# Patient Record
Sex: Female | Born: 1990 | Race: White | Hispanic: No | Marital: Married | Smoking: Former smoker
Health system: Southern US, Community
[De-identification: ages and names within clinical notes are randomized; demographics above are authoritative.]

## PROBLEM LIST (undated history)

## (undated) DIAGNOSIS — F329 Major depressive disorder, single episode, unspecified: Secondary | ICD-10-CM

## (undated) DIAGNOSIS — F32A Depression, unspecified: Secondary | ICD-10-CM

## (undated) DIAGNOSIS — Z8489 Family history of other specified conditions: Secondary | ICD-10-CM

## (undated) DIAGNOSIS — J45909 Unspecified asthma, uncomplicated: Secondary | ICD-10-CM

## (undated) DIAGNOSIS — K219 Gastro-esophageal reflux disease without esophagitis: Secondary | ICD-10-CM

## (undated) HISTORY — DX: Depression, unspecified: F32.A

## (undated) HISTORY — DX: Unspecified asthma, uncomplicated: J45.909

---
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
## 2008-09-28 ENCOUNTER — Emergency Department (HOSPITAL_COMMUNITY): Admission: EM | Admit: 2008-09-28 | Discharge: 2008-09-28 | Payer: Self-pay | Admitting: Emergency Medicine

## 2011-01-21 ENCOUNTER — Encounter: Payer: Self-pay | Admitting: Gastroenterology

## 2011-10-01 LAB — DIFFERENTIAL
Basophils Absolute: 0
Basophils Relative: 0
Eosinophils Absolute: 0
Monocytes Absolute: 0.4
Neutro Abs: 4.1
Neutrophils Relative %: 82 — ABNORMAL HIGH

## 2011-10-01 LAB — URINALYSIS, ROUTINE W REFLEX MICROSCOPIC
Bilirubin Urine: NEGATIVE
Glucose, UA: NEGATIVE
Hgb urine dipstick: NEGATIVE
pH: 7.5

## 2011-10-01 LAB — CBC
Hemoglobin: 13.1
MCHC: 33.9
MCV: 87.2
RBC: 4.43
RDW: 12.7

## 2011-10-01 LAB — COMPREHENSIVE METABOLIC PANEL
ALT: 17
AST: 20
Albumin: 3.9
Glucose, Bld: 88
Total Bilirubin: 0.7
Total Protein: 6.7

## 2013-04-02 ENCOUNTER — Telehealth: Payer: Self-pay | Admitting: Nurse Practitioner

## 2013-04-02 NOTE — Telephone Encounter (Signed)
APPT MADE

## 2013-04-09 ENCOUNTER — Ambulatory Visit: Payer: Self-pay | Admitting: Nurse Practitioner

## 2013-05-18 ENCOUNTER — Telehealth: Payer: Self-pay | Admitting: *Deleted

## 2013-05-18 NOTE — Telephone Encounter (Signed)
Created in error

## 2013-06-02 ENCOUNTER — Encounter: Payer: Self-pay | Admitting: Nurse Practitioner

## 2013-06-02 ENCOUNTER — Ambulatory Visit (INDEPENDENT_AMBULATORY_CARE_PROVIDER_SITE_OTHER): Payer: PRIVATE HEALTH INSURANCE | Admitting: Nurse Practitioner

## 2013-06-02 VITALS — BP 103/66 | HR 90 | Temp 97.0°F | Ht 67.0 in | Wt 155.0 lb

## 2013-06-02 DIAGNOSIS — F329 Major depressive disorder, single episode, unspecified: Secondary | ICD-10-CM | POA: Insufficient documentation

## 2013-06-02 MED ORDER — CITALOPRAM HYDROBROMIDE 20 MG PO TABS: 20.0000 mg | ORAL_TABLET | Freq: Every day | ORAL | Status: DC

## 2013-06-02 NOTE — Progress Notes (Signed)
  Subjective:    Patient ID: Shandel Busic, female    DOB: Feb 16, 1991, 22 y.o.   MRN: [id number]  HPI Patient currently on celexa for depression and anxiety- she is doing well on current dose- has no side effects from medication.    Review of Systems  All other systems reviewed and are negative.       Objective:   Physical Exam  Constitutional: She appears well-developed and well-nourished.  Cardiovascular: Normal rate, normal heart sounds and intact distal pulses.   Pulmonary/Chest: Effort normal and breath sounds normal.  Psychiatric: She has a normal mood and affect. Her behavior is normal. Judgment and thought content normal.    BP 103/66  Pulse 90  Temp(Src) 97 F (36.1 C) (Oral)  Ht 5\' 7"  (1.702 m)  Wt 155 lb (70.308 kg)  BMI 24.27 kg/m2  LMP 06/02/2013       Assessment & Plan:  1. Depression Stress management Exercise 3X a week - citalopram (CELEXA) 20 MG tablet; Take 1 tablet (20 mg total) by mouth daily.  Dispense: 30 tablet; Refill: 5   Mary-Margaret Daphine Deutscher, FNP

## 2013-06-02 NOTE — Patient Instructions (Signed)

## 2013-10-21 ENCOUNTER — Other Ambulatory Visit: Payer: Self-pay

## 2013-10-21 MED ORDER — BUDESONIDE-FORMOTEROL FUMARATE 160-4.5 MCG/ACT IN AERO: 2.0000 | INHALATION_SPRAY | Freq: Two times a day (BID) | RESPIRATORY_TRACT | Status: DC

## 2013-11-24 ENCOUNTER — Ambulatory Visit (INDEPENDENT_AMBULATORY_CARE_PROVIDER_SITE_OTHER): Payer: 59 | Admitting: General Practice

## 2013-11-24 ENCOUNTER — Encounter: Payer: Self-pay | Admitting: General Practice

## 2013-11-24 VITALS — BP 129/79 | HR 115 | Temp 97.6°F | Ht 67.0 in | Wt 162.0 lb

## 2013-11-24 DIAGNOSIS — J45909 Unspecified asthma, uncomplicated: Secondary | ICD-10-CM

## 2013-11-24 NOTE — Progress Notes (Signed)
  Subjective:    Patient ID: Misty Butler, female    DOB: April 25, 1991, 22 y.o.   MRN: [id number]  HPI Patient presents today for medication refills for albuterol and symbicort. She reports using medications as prescribed and finds it very effective.     Review of Systems  Constitutional: Negative for fever and chills.  Respiratory: Positive for cough and shortness of breath. Negative for chest tightness and wheezing.        Coughing and shortness of breath on exertion  Cardiovascular: Negative for chest pain and palpitations.  Neurological: Negative for dizziness, weakness and headaches.       Objective:   Physical Exam  Constitutional: She is oriented to person, place, and time. She appears well-developed and well-nourished.  Cardiovascular: Normal rate, regular rhythm and normal heart sounds.   Pulmonary/Chest: Effort normal and breath sounds normal. No respiratory distress. She exhibits no tenderness.  Neurological: She is alert and oriented to person, place, and time.  Skin: Skin is warm and dry.  Psychiatric: She has a normal mood and affect.          Assessment & Plan:  1. Asthma - budesonide-formoterol (SYMBICORT) 160-4.5 MCG/ACT inhaler; Inhale 2 puffs into the lungs 2 (two) times daily.  Dispense: 1 Inhaler; Refill: 3 - albuterol (PROVENTIL HFA;VENTOLIN HFA) 108 (90 BASE) MCG/ACT inhaler; Inhale 2 puffs into the lungs every 6 (six) hours as needed for wheezing or shortness of breath.  Dispense: 1 Inhaler; Refill: 3 -discussed and provided information on asthma -RTO prn and in 6 months for follow up Patient verbalized understanding Coralie Keens, FNP-C

## 2013-11-24 NOTE — Patient Instructions (Signed)
Asthma, Adult Asthma is a recurring condition in which the airways tighten and narrow. Asthma can make it difficult to breathe. It can cause coughing, wheezing, and shortness of breath. Asthma episodes (also called asthma attacks) range from minor to life-threatening. Asthma cannot be cured, but medicines and lifestyle changes can help control it. CAUSES Asthma is believed to be caused by inherited (genetic) and environmental factors, but its exact cause is unknown. Asthma may be triggered by allergens, lung infections, or irritants in the air. Asthma triggers are different for each person. Common triggers include:   Animal dander.  Dust mites.  Cockroaches.  Pollen from trees or grass.  Mold.  Smoke.  Air pollutants such as dust, household cleaners, hair sprays, aerosol sprays, paint fumes, strong chemicals, or strong odors.  Cold air, weather changes, and winds (which increase molds and pollens in the air).  Strong emotional expressions such as crying or laughing hard.  Stress.  Certain medicines (such as aspirin) or types of drugs (such as beta-blockers).  Sulfites in foods and drinks. Foods and drinks that may contain sulfites include dried fruit, potato chips, and sparkling grape juice.  Infections or inflammatory conditions such as the flu, a cold, or an inflammation of the nasal membranes (rhinitis).  Gastroesophageal reflux disease (GERD).  Exercise or strenuous activity. SYMPTOMS Symptoms may occur immediately after asthma is triggered or many hours later. Symptoms include:  Wheezing.  Excessive nighttime or early morning coughing.  Frequent or severe coughing with a common cold.  Chest tightness.  Shortness of breath. DIAGNOSIS  The diagnosis of asthma is made by a review of your medical history and a physical exam. Tests may also be performed. These may include:  Lung function studies. These tests show how much air you breath in and out.  Allergy  tests.  Imaging tests such as X-rays. TREATMENT  Asthma cannot be cured, but it can usually be controlled. Treatment involves identifying and avoiding your asthma triggers. It also involves medicines. There are 2 classes of medicine used for asthma treatment:   Controller medicines. These prevent asthma symptoms from occurring. They are usually taken every day.  Reliever or rescue medicines. These quickly relieve asthma symptoms. They are used as needed and provide short-term relief. Your health care provider will help you create an asthma action plan. An asthma action plan is a written plan for managing and treating your asthma attacks. It includes a list of your asthma triggers and how they may be avoided. It also includes information on when medicines should be taken and when their dosage should be changed. An action plan may also involve the use of a device called a peak flow meter. A peak flow meter measures how well the lungs are working. It helps you monitor your condition. HOME CARE INSTRUCTIONS   Take medicine as directed by your health care provider. Speak with your health care provider if you have questions about how or when to take the medicines.  Use a peak flow meter as directed by your health care provider. Record and keep track of readings.  Understand and use the action plan to help minimize or stop an asthma attack without needing to seek medical care.  Control your home environment in the following ways to help prevent asthma attacks:  Do not smoke. Avoid being exposed to secondhand smoke.  Change your heating and air conditioning filter regularly.  Limit your use of fireplaces and wood stoves.  Get rid of pests (such as roaches and   mice) and their droppings.  Throw away plants if you see mold on them.  Clean your floors and dust regularly. Use unscented cleaning products.  Try to have someone else vacuum for you regularly. Stay out of rooms while they are being  vacuumed and for a short while afterward. If you vacuum, use a dust mask from a hardware store, a double-layered or microfilter vacuum cleaner bag, or a vacuum cleaner with a HEPA filter.  Replace carpet with wood, tile, or vinyl flooring. Carpet can trap dander and dust.  Use allergy-proof pillows, mattress covers, and box spring covers.  Wash bed sheets and blankets every week in hot water and dry them in a dryer.  Use blankets that are made of polyester or cotton.  Clean bathrooms and kitchens with bleach. If possible, have someone repaint the walls in these rooms with mold-resistant paint. Keep out of the rooms that are being cleaned and painted.  Wash hands frequently. SEEK MEDICAL CARE IF:   You have wheezing, shortness of breath, or a cough even if taking medicine to prevent attacks.  The colored mucus you cough up (sputum) is thicker than usual.  Your sputum changes from clear or white to yellow, green, gray, or bloody.  You have any problems that may be related to the medicines you are taking (such as a rash, itching, swelling, or trouble breathing).  You are using a reliever medicine more than 2 3 times per week.  Your peak flow is still at 50 79% of you personal best after following your action plan for 1 hour. SEEK IMMEDIATE MEDICAL CARE IF:   You seem to be getting worse and are unresponsive to treatment during an asthma attack.  You are short of breath even at rest.  You get short of breath when doing very little physical activity.  You have difficulty eating, drinking, or talking due to asthma symptoms.  You develop chest pain.  You develop a fast heartbeat.  You have a bluish color to your lips or fingernails.  You are lightheaded, dizzy, or faint.  Your peak flow is less than 50% of your personal best.  You have a fever or persistent symptoms for more than 2 3 days.  You have a fever and symptoms suddenly get worse. MAKE SURE YOU:   Understand these  instructions.  Will watch your condition.  Will get help right away if you are not doing well or get worse. Document Released: 12/17/2005 Document Revised: 08/19/2013 Document Reviewed: 07/16/2013 Exit[employer] Patient Information 2014 [employer].  

## 2013-11-25 ENCOUNTER — Telehealth: Payer: Self-pay | Admitting: General Practice

## 2013-11-27 ENCOUNTER — Other Ambulatory Visit: Payer: Self-pay | Admitting: General Practice

## 2013-11-27 MED ORDER — ALBUTEROL SULFATE HFA 108 (90 BASE) MCG/ACT IN AERS: 2.0000 | INHALATION_SPRAY | Freq: Four times a day (QID) | RESPIRATORY_TRACT | Status: DC | PRN

## 2013-11-27 MED ORDER — BUDESONIDE-FORMOTEROL FUMARATE 160-4.5 MCG/ACT IN AERO: 2.0000 | INHALATION_SPRAY | Freq: Two times a day (BID) | RESPIRATORY_TRACT | Status: DC

## 2013-11-27 NOTE — Telephone Encounter (Signed)
She should check again, they were sent after she checked.

## 2013-11-27 NOTE — Telephone Encounter (Signed)
Patient aware.

## 2014-04-28 ENCOUNTER — Telehealth: Payer: Self-pay | Admitting: Nurse Practitioner

## 2014-04-28 NOTE — Telephone Encounter (Signed)
Noticed swelling below left eye 1 week ago. Some tenderness.  Swelling has improved. She has had some crusting over the past couple days but that was better today as well.  Today she woke up with upper eyelid swelling, redness, and discomfort.  No eyeball pain or visual disturbance.  She wears glasses and not contacts. She hasn't worn eye makeup in several weeks. No known injury.  Patient requested appt for 05/03/16 because she is off of work. Appt scheduled. Patient aware to call back if symptoms worsen or if she develops actual eye pain or visual disturbances to f/u with her eye doctor. Suggested cool compresses and ibuprofen for comfort.

## 2014-04-29 ENCOUNTER — Other Ambulatory Visit: Payer: Self-pay | Admitting: Nurse Practitioner

## 2014-04-29 ENCOUNTER — Encounter: Payer: Self-pay | Admitting: Nurse Practitioner

## 2014-04-29 ENCOUNTER — Ambulatory Visit (INDEPENDENT_AMBULATORY_CARE_PROVIDER_SITE_OTHER): Payer: BC Managed Care – PPO | Admitting: Nurse Practitioner

## 2014-04-29 VITALS — BP 111/78 | HR 68 | Temp 97.2°F | Ht 67.0 in | Wt 162.0 lb

## 2014-04-29 DIAGNOSIS — H01006 Unspecified blepharitis left eye, unspecified eyelid: Secondary | ICD-10-CM

## 2014-04-29 DIAGNOSIS — J45909 Unspecified asthma, uncomplicated: Secondary | ICD-10-CM

## 2014-04-29 DIAGNOSIS — H01009 Unspecified blepharitis unspecified eye, unspecified eyelid: Secondary | ICD-10-CM

## 2014-04-29 MED ORDER — ERYTHROMYCIN 5 MG/GM OP OINT
1.0000 | TOPICAL_OINTMENT | Freq: Four times a day (QID) | OPHTHALMIC | Status: DC
Start: 2014-04-29 — End: 2015-04-15

## 2014-04-29 MED ORDER — BUDESONIDE-FORMOTEROL FUMARATE 160-4.5 MCG/ACT IN AERO: 2.0000 | INHALATION_SPRAY | Freq: Two times a day (BID) | RESPIRATORY_TRACT | Status: DC

## 2014-04-29 NOTE — Patient Instructions (Signed)
Blepharitis Blepharitis is redness, soreness, and swelling (inflammation) of one or both eyelids. It may be caused by an allergic reaction or a bacterial infection. Blepharitis may also be associated with reddened, scaly skin (seborrhea) of the scalp and eyebrows. While you sleep, eye discharge may cause your eyelashes to stick together. Your eyelids may itch, burn, swell, and may lose their lashes. These will grow back. Your eyes may become sensitive. Blepharitis may recur and need repeated treatment. If this is the case, you may require further evaluation by an eye specialist (ophthalmologist). HOME CARE INSTRUCTIONS   Keep your hands clean.  Use a clean towel each time you dry your eyelids. Do not use this towel to clean other areas. Do not share a towel or makeup with anyone.  Wash your eyelids with warm water or warm water mixed with a small amount of baby shampoo. Do this twice a day or as often as needed.  Wash your face and eyebrows at least once a day.  Use warm compresses 2 times a day for 10 minutes at a time, or as directed by your caregiver.  Apply antibiotic ointment as directed by your caregiver.  Avoid rubbing your eyes.  Avoid wearing makeup until you get better.  Follow up with your caregiver as directed. SEEK IMMEDIATE MEDICAL CARE IF:   You have pain, redness, or swelling that gets worse or spreads to other parts of your face.  Your vision changes, or you have pain when looking at lights or moving objects.  You have a fever.  Your symptoms continue for longer than 2 to 4 days or become worse. MAKE SURE YOU:   Understand these instructions.  Will watch your condition.  Will get help right away if you are not doing well or get worse. Document Released: 12/14/2000 Document Revised: 03/10/2012 Document Reviewed: 01/24/2011 Exit[employer] Patient Information 2014 [employer].  

## 2014-04-29 NOTE — Progress Notes (Signed)
   Subjective:    Patient ID: Misty Butler, female    DOB: [birth year], 23 y.o.   MRN: [id number]  HPI Patient in c/o left upper lid swelling- Has had a crusty flaky discharge.- Eye lid is tender to touch.    Review of Systems  Constitutional: Negative.   HENT: Negative.   Eyes: Positive for pain and discharge. Negative for photophobia, redness, itching and visual disturbance.  Respiratory: Negative.   Cardiovascular: Negative.   Neurological: Negative.   Hematological: Negative.   All other systems reviewed and are negative.      Objective:   Physical Exam  Constitutional: She is oriented to person, place, and time. She appears well-developed and well-nourished.  Eyes: Conjunctivae and EOM are normal. Pupils are equal, round, and reactive to light. Left eye exhibits no discharge.  Left upper eyelid edema and tenderness  Cardiovascular: Normal rate and normal heart sounds.   Pulmonary/Chest: Effort normal and breath sounds normal.  Neurological: She is alert and oriented to person, place, and time.  Skin: Skin is warm and dry.  Psychiatric: She has a normal mood and affect. Her behavior is normal. Judgment and thought content normal.   BP 111/78  Pulse 68  Temp(Src) 97.2 F (36.2 C) (Oral)  Ht 5\' 7"  (1.702 m)  Wt 162 lb (73.483 kg)  BMI 25.37 kg/m2  LMP 04/29/2014        Assessment & Plan:   1. Blepharitis of left eye    Meds ordered this encounter  Medications  . erythromycin ophthalmic ointment    Sig: Place 1 application into the left eye 4 (four) times daily.    Dispense:  3.5 g    Refill:  0    Order Specific Question:  Supervising Provider    Answer:  Ernestina PennaMOORE, DONALD W [[id number]   Use of med discussed Warm compresses RTO prn  Mary-Margaret Daphine DeutscherMartin, FNP

## 2014-05-01 ENCOUNTER — Ambulatory Visit (INDEPENDENT_AMBULATORY_CARE_PROVIDER_SITE_OTHER): Payer: BC Managed Care – PPO | Admitting: General Practice

## 2014-05-01 ENCOUNTER — Encounter: Payer: Self-pay | Admitting: General Practice

## 2014-05-01 VITALS — BP 118/73 | HR 95 | Temp 97.3°F | Ht 67.0 in | Wt 162.8 lb

## 2014-05-01 DIAGNOSIS — H01009 Unspecified blepharitis unspecified eye, unspecified eyelid: Secondary | ICD-10-CM

## 2014-05-01 DIAGNOSIS — H01004 Unspecified blepharitis left upper eyelid: Secondary | ICD-10-CM

## 2014-05-01 MED ORDER — DOXYCYCLINE HYCLATE 100 MG PO TABS: 100.0000 mg | ORAL_TABLET | Freq: Two times a day (BID) | ORAL | Status: DC

## 2014-05-01 NOTE — Progress Notes (Signed)
   Subjective:    Patient ID: Misty Butler, female    DOB: 03/21/1991, 23 y.o.   MRN: [id number]  HPI Patient presents today with complaints of worsening left upper eyelid swelling and tenderness. The onset of eyelid swelling was 3 days ago, she was seen and prescribed erythromycin ointment two days ago. Reports using ointment as prescribed and following other home care instructions. Denies change or loss in vision. Currently on menstrual cycle.     Review of Systems  Constitutional: Negative for fever and chills.  Eyes: Negative for photophobia, discharge and visual disturbance.       Left upper eyelid swelling and tenderness  Respiratory: Negative for cough, chest tightness and shortness of breath.   Cardiovascular: Negative for chest pain and palpitations.       Objective:   Physical Exam  Constitutional: She is oriented to person, place, and time. She appears well-developed and well-nourished.  HENT:  Head: Normocephalic and atraumatic.  Right Ear: External ear normal.  Mouth/Throat: Oropharynx is clear and moist.  Eyes: Conjunctivae and EOM are normal. Pupils are equal, round, and reactive to light.  Left upper eyelid erythema and mild edema.   Cardiovascular: Normal rate, regular rhythm and normal heart sounds.   Pulmonary/Chest: Effort normal and breath sounds normal. No respiratory distress. She exhibits no tenderness.  Neurological: She is alert and oriented to person, place, and time.  Skin: Skin is warm and dry.  Psychiatric: She has a normal mood and affect.          Assessment & Plan:  1. Blepharitis of left upper eyelid - doxycycline (VIBRA-TABS) 100 MG tablet; Take 1 tablet (100 mg total) by mouth 2 (two) times daily.  Dispense: 20 tablet; Refill: 0 -discussed and provided home care instructions -complete full course of antibiotics -RTO if symptoms worsen or unresolved -may seek emergency medical treatment -Patient verbalized understanding Coralie KeensMae E.  Laquincy Eastridge, FNP-C

## 2014-05-01 NOTE — Patient Instructions (Signed)
Blepharitis Blepharitis is redness, soreness, and swelling (inflammation) of one or both eyelids. It may be caused by an allergic reaction or a bacterial infection. Blepharitis may also be associated with reddened, scaly skin (seborrhea) of the scalp and eyebrows. While you sleep, eye discharge may cause your eyelashes to stick together. Your eyelids may itch, burn, swell, and may lose their lashes. These will grow back. Your eyes may become sensitive. Blepharitis may recur and need repeated treatment. If this is the case, you may require further evaluation by an eye specialist (ophthalmologist). HOME CARE INSTRUCTIONS   Keep your hands clean.  Use a clean towel each time you dry your eyelids. Do not use this towel to clean other areas. Do not share a towel or makeup with anyone.  Wash your eyelids with warm water or warm water mixed with a small amount of baby shampoo. Do this twice a day or as often as needed.  Wash your face and eyebrows at least once a day.  Use warm compresses 2 times a day for 10 minutes at a time, or as directed by your caregiver.  Apply antibiotic ointment as directed by your caregiver.  Avoid rubbing your eyes.  Avoid wearing makeup until you get better.  Follow up with your caregiver as directed. SEEK IMMEDIATE MEDICAL CARE IF:   You have pain, redness, or swelling that gets worse or spreads to other parts of your face.  Your vision changes, or you have pain when looking at lights or moving objects.  You have a fever.  Your symptoms continue for longer than 2 to 4 days or become worse. MAKE SURE YOU:   Understand these instructions.  Will watch your condition.  Will get help right away if you are not doing well or get worse. Document Released: 12/14/2000 Document Revised: 03/10/2012 Document Reviewed: 01/24/2011 Exit[employer] Patient Information 2014 [employer].  

## 2014-05-03 ENCOUNTER — Ambulatory Visit: Payer: 59 | Admitting: Family Medicine

## 2014-12-22 ENCOUNTER — Other Ambulatory Visit: Payer: Self-pay | Admitting: Nurse Practitioner

## 2015-02-11 ENCOUNTER — Other Ambulatory Visit: Payer: Self-pay | Admitting: Family Medicine

## 2015-02-11 NOTE — Telephone Encounter (Signed)
Last seen for asthma 10/2013

## 2015-03-03 ENCOUNTER — Other Ambulatory Visit: Payer: Self-pay | Admitting: Nurse Practitioner

## 2015-04-06 ENCOUNTER — Other Ambulatory Visit: Payer: Self-pay | Admitting: Nurse Practitioner

## 2015-04-07 ENCOUNTER — Other Ambulatory Visit: Payer: Self-pay | Admitting: *Deleted

## 2015-04-07 ENCOUNTER — Telehealth: Payer: Self-pay | Admitting: Nurse Practitioner

## 2015-04-07 MED ORDER — BUDESONIDE-FORMOTEROL FUMARATE 160-4.5 MCG/ACT IN AERO: INHALATION_SPRAY | RESPIRATORY_TRACT | Status: DC

## 2015-04-07 NOTE — Telephone Encounter (Signed)
Appt made for next Friday, sample put at front office to be picked up

## 2015-04-15 ENCOUNTER — Encounter: Payer: Self-pay | Admitting: Nurse Practitioner

## 2015-04-15 ENCOUNTER — Ambulatory Visit (INDEPENDENT_AMBULATORY_CARE_PROVIDER_SITE_OTHER): Payer: BLUE CROSS/BLUE SHIELD | Admitting: Nurse Practitioner

## 2015-04-15 VITALS — BP 114/73 | HR 91 | Temp 97.0°F | Ht 67.0 in | Wt 171.0 lb

## 2015-04-15 DIAGNOSIS — R61 Generalized hyperhidrosis: Secondary | ICD-10-CM | POA: Diagnosis not present

## 2015-04-15 DIAGNOSIS — J4521 Mild intermittent asthma with (acute) exacerbation: Secondary | ICD-10-CM

## 2015-04-15 MED ORDER — ALBUTEROL SULFATE HFA 108 (90 BASE) MCG/ACT IN AERS: 2.0000 | INHALATION_SPRAY | Freq: Four times a day (QID) | RESPIRATORY_TRACT | Status: DC | PRN

## 2015-04-15 MED ORDER — BUDESONIDE-FORMOTEROL FUMARATE 160-4.5 MCG/ACT IN AERO: INHALATION_SPRAY | RESPIRATORY_TRACT | Status: DC

## 2015-04-15 MED ORDER — ALUMINUM CHLORIDE 20 % EX SOLN: Freq: Every day | CUTANEOUS | Status: DC

## 2015-04-15 NOTE — Progress Notes (Signed)
   Subjective:    Patient ID: Misty Butler, female    DOB: February 09, 1991, 24 y.o.   MRN: [id number]  HPI Patient in today for follow up of asthma- has gotten worse since all the pollen is in the air- SHe uses symbicort BID and albuterol as needed. Only use albuterol once every couple of months. She is also c/o excessive sweating of arm pits- has become a problem.    Review of Systems  Constitutional: Negative.   HENT: Negative.   Respiratory: Negative.   Cardiovascular: Negative.   Genitourinary: Negative.   Psychiatric/Behavioral: Negative.   All other systems reviewed and are negative.      Objective:   Physical Exam  Constitutional: She is oriented to person, place, and time. She appears well-developed and well-nourished.  Cardiovascular: Normal rate, regular rhythm and normal heart sounds.   Pulmonary/Chest: Effort normal and breath sounds normal.  Neurological: She is alert and oriented to person, place, and time.  Skin: Skin is warm and dry.  Psychiatric: She has a normal mood and affect. Her behavior is normal. Judgment and thought content normal.    BP 114/73 mmHg  Pulse 91  Temp(Src) 97 F (36.1 C) (Oral)  Ht 5\' 7"  (1.702 m)  Wt 171 lb (77.565 kg)  BMI 26.78 kg/m2       Assessment & Plan:  1. Asthma, mild intermittent, with acute exacerbation Avoid allergens - budesonide-formoterol (SYMBICORT) 160-4.5 MCG/ACT inhaler; INHALE 2 PUFFS INTO THE LUNGS 2 TIMES A DAY  Dispense: 6 g; Refill: 6 - albuterol (PROVENTIL HFA;VENTOLIN HFA) 108 (90 BASE) MCG/ACT inhaler; Inhale 2 puffs into the lungs every 6 (six) hours as needed for wheezing or shortness of breath.  Dispense: 1 Inhaler; Refill: 3  2. Excessive sweating - aluminum chloride (DRYSOL) 20 % external solution; Apply topically at bedtime.  Dispense: 60 mL; Refill: 2  Mary-Margaret Daphine DeutscherMartin, FNP

## 2015-04-15 NOTE — Patient Instructions (Addendum)
Underarm, Excessive Sweating The medical name for excessive sweating under the arms is primary axillary hyperhidrosis. This condition can interfere with regular social interaction. It can have emotional and professional consequences for some people. Botulinum Toxin Type A (Botox) is an approved treatment for severe underarm sweating. This is used only when the problem can not be managed by prescription antiperspirants. Botox is approved for several other purposes. Botox has also been shown to help with excessive sweating of the palms. BEFORE THE PROCEDURE  Before being treated for excessive sweating, patients should be checked for other possible causes of the problem. This avoids treating a symptom which could be caused by some other serious disease that needs a different treatment. PROCEDURE  Botox is a protein produced by the germ (bacterium) Clostridium botulinum. Small doses are injected under the arms to control sweating. These injections stop the release of acetylcholine. Acetylcholine is a chemical messenger which makes you sweat. These injections temporarily block the nerves in the underarm from producing this messenger. The reduction in sweating may last for an average of 7 months and up to 16 months after one injection. RISK AND COMPLICATIONS The most common bad reactions are:  Injection site pain and bleeding.  Sweating in other parts of the body.  Flu-like symptoms.  Headache.  Fever.  Itching.  Anxiety. Rarely, it can cause a generalized paralysis.  The safety and effectiveness of Botox for excessive sweating in other body areas is not known. Botox is a prescription drug. It must be used carefully under medical supervision. It should be used only for approved indications.  Document Released: 02/15/2006 Document Revised: 03/10/2012 Document Reviewed: 12/22/2008 Exit[employer] Patient Information 2015 [employer]. This information is not intended to replace advice given to you by  your health care provider. Make sure you discuss any questions you have with your health care provider.  

## 2015-04-28 ENCOUNTER — Other Ambulatory Visit: Payer: Self-pay | Admitting: Nurse Practitioner

## 2015-05-25 ENCOUNTER — Other Ambulatory Visit: Payer: Self-pay | Admitting: Nurse Practitioner

## 2015-07-14 ENCOUNTER — Telehealth: Payer: Self-pay | Admitting: Nurse Practitioner

## 2015-07-14 MED ORDER — ERYTHROMYCIN 5 MG/GM OP OINT: 1.0000 "application " | TOPICAL_OINTMENT | Freq: Every day | OPHTHALMIC | Status: DC

## 2015-07-14 NOTE — Telephone Encounter (Signed)
Aware of new medication sent to pharmacy.

## 2015-07-14 NOTE — Telephone Encounter (Signed)
Erythromycin oint rx sent to pharmacy

## 2015-11-29 ENCOUNTER — Ambulatory Visit (INDEPENDENT_AMBULATORY_CARE_PROVIDER_SITE_OTHER): Payer: BLUE CROSS/BLUE SHIELD | Admitting: Family

## 2015-11-29 ENCOUNTER — Encounter: Payer: Self-pay | Admitting: Family

## 2015-11-29 VITALS — BP 127/79 | HR 119 | Temp 101.4°F | Ht 67.0 in | Wt 170.2 lb

## 2015-11-29 DIAGNOSIS — R6889 Other general symptoms and signs: Secondary | ICD-10-CM | POA: Diagnosis not present

## 2015-11-29 DIAGNOSIS — J069 Acute upper respiratory infection, unspecified: Secondary | ICD-10-CM | POA: Diagnosis not present

## 2015-11-29 LAB — POCT RAPID STREP A (OFFICE): RAPID STREP A SCREEN: NEGATIVE

## 2015-11-29 LAB — POCT INFLUENZA A/B
INFLUENZA A, POC: NEGATIVE
INFLUENZA B, POC: NEGATIVE

## 2015-11-29 MED ORDER — AMOXICILLIN-POT CLAVULANATE 875-125 MG PO TABS: 1.0000 | ORAL_TABLET | Freq: Two times a day (BID) | ORAL | Status: DC

## 2015-11-29 NOTE — Patient Instructions (Signed)
Upper Respiratory Infection, Adult Most upper respiratory infections (URIs) are a viral infection of the air passages leading to the lungs. A URI affects the nose, throat, and upper air passages. The most common type of URI is nasopharyngitis and is typically referred to as "the common cold." URIs run their course and usually go away on their own. Most of the time, a URI does not require medical attention, but sometimes a bacterial infection in the upper airways can follow a viral infection. This is called a secondary infection. Sinus and middle ear infections are common types of secondary upper respiratory infections. Bacterial pneumonia can also complicate a URI. A URI can worsen asthma and chronic obstructive pulmonary disease (COPD). Sometimes, these complications can require emergency medical care and may be life threatening.  CAUSES Almost all URIs are caused by viruses. A virus is a type of germ and can spread from one person to another.  RISKS FACTORS You may be at risk for a URI if:   You smoke.   You have chronic heart or lung disease.  You have a weakened defense (immune) system.   You are very young or very old.   You have nasal allergies or asthma.  You work in crowded or poorly ventilated areas.  You work in health care facilities or schools. SIGNS AND SYMPTOMS  Symptoms typically develop 2-3 days after you come in contact with a cold virus. Most viral URIs last 7-10 days. However, viral URIs from the influenza virus (flu virus) can last 14-18 days and are typically more severe. Symptoms may include:   Runny or stuffy (congested) nose.   Sneezing.   Cough.   Sore throat.   Headache.   Fatigue.   Fever.   Loss of appetite.   Pain in your forehead, behind your eyes, and over your cheekbones (sinus pain).  Muscle aches.  DIAGNOSIS  Your health care provider may diagnose a URI by:  Physical exam.  Tests to check that your symptoms are not due to  another condition such as:  Strep throat.  Sinusitis.  Pneumonia.  Asthma. TREATMENT  A URI goes away on its own with time. It cannot be cured with medicines, but medicines may be prescribed or recommended to relieve symptoms. Medicines may help:  Reduce your fever.  Reduce your cough.  Relieve nasal congestion. HOME CARE INSTRUCTIONS   Take medicines only as directed by your health care provider.   Gargle warm saltwater or take cough drops to comfort your throat as directed by your health care provider.  Use a warm mist humidifier or inhale steam from a shower to increase air moisture. This may make it easier to breathe.  Drink enough fluid to keep your urine clear or pale yellow.   Eat soups and other clear broths and maintain good nutrition.   Rest as needed.   Return to work when your temperature has returned to normal or as your health care provider advises. You may need to stay home longer to avoid infecting others. You can also use a face mask and careful hand washing to prevent spread of the virus.  Increase the usage of your inhaler if you have asthma.   Do not use any tobacco products, including cigarettes, chewing tobacco, or electronic cigarettes. If you need help quitting, ask your health care provider. PREVENTION  The best way to protect yourself from getting a cold is to practice good hygiene.   Avoid oral or hand contact with people with cold   symptoms.   Wash your hands often if contact occurs.  There is no clear evidence that vitamin C, vitamin E, echinacea, or exercise reduces the chance of developing a cold. However, it is always recommended to get plenty of rest, exercise, and practice good nutrition.  SEEK MEDICAL CARE IF:   You are getting worse rather than better.   Your symptoms are not controlled by medicine.   You have chills.  You have worsening shortness of breath.  You have brown or red mucus.  You have yellow or brown nasal  discharge.  You have pain in your face, especially when you bend forward.  You have a fever.  You have swollen neck glands.  You have pain while swallowing.  You have white areas in the back of your throat. SEEK IMMEDIATE MEDICAL CARE IF:   You have severe or persistent:  Headache.  Ear pain.  Sinus pain.  Chest pain.  You have chronic lung disease and any of the following:  Wheezing.  Prolonged cough.  Coughing up blood.  A change in your usual mucus.  You have a stiff neck.  You have changes in your:  Vision.  Hearing.  Thinking.  Mood. MAKE SURE YOU:   Understand these instructions.  Will watch your condition.  Will get help right away if you are not doing well or get worse.   This information is not intended to replace advice given to you by your health care provider. Make sure you discuss any questions you have with your health care provider.   Document Released: 06/12/2001 Document Revised: 05/03/2015 Document Reviewed: 03/24/2014 Elsevier Interactive Patient Education 2016 Elsevier Inc.  - Take meds as prescribed - Use a cool mist humidifier  -Use saline nose sprays frequently -Saline irrigations of the nose can be very helpful if done frequently.  * 4X daily for 1 week*  * Use of a nettie pot can be helpful with this. Follow directions with this* -Force fluids -For any cough or congestion  Use plain Mucinex- regular strength or max strength is fine   * Children- consult with Pharmacist for dosing -For fever or aces or pains- take tylenol or ibuprofen appropriate for age and weight.  * for fevers greater than 101 orally you may alternate ibuprofen and tylenol every  3 hours. -Throat lozenges if help -New toothbrush in 3 days   Telina Kleckley, FNP  

## 2015-11-29 NOTE — Progress Notes (Signed)
Subjective:    Patient ID: Misty Butler, female    DOB: 1991/01/31, 24 y.o.   MRN: [id number]  Ear Fullness  Associated symptoms include a sore throat. Pertinent negatives include no coughing, diarrhea, ear discharge, headaches or vomiting.  Sore Throat  This is a new problem. The current episode started yesterday. The problem has been waxing and waning. The maximum temperature recorded prior to her arrival was 101 - 101.9 F. The pain is at a severity of 4/10. The pain is mild. Associated symptoms include congestion, ear pain, a hoarse voice and a plugged ear sensation. Pertinent negatives include no coughing, diarrhea, ear discharge, headaches, shortness of breath, trouble swallowing or vomiting. She has had no exposure to strep or mono. She has tried acetaminophen for the symptoms. The treatment provided mild relief.  Fever  This is a new problem. The current episode started yesterday. The problem occurs constantly. The problem has been unchanged. The maximum temperature noted was 101 to 101.9 F. Associated symptoms include congestion, ear pain, muscle aches and a sore throat. Pertinent negatives include no coughing, diarrhea, headaches, nausea, vomiting or wheezing. She has tried acetaminophen for the symptoms. The treatment provided mild relief.      Review of Systems  Constitutional: Positive for fever.  HENT: Positive for congestion, ear pain, hoarse voice and sore throat. Negative for ear discharge and trouble swallowing.   Eyes: Negative.   Respiratory: Negative.  Negative for cough, shortness of breath and wheezing.   Cardiovascular: Negative.  Negative for palpitations.  Gastrointestinal: Negative.  Negative for nausea, vomiting and diarrhea.  Endocrine: Negative.   Genitourinary: Negative.   Musculoskeletal: Negative.   Neurological: Negative.  Negative for headaches.  Hematological: Negative.   Psychiatric/Behavioral: Negative.   All other systems reviewed and are  negative.      Objective:   Physical Exam  Constitutional: She is oriented to person, place, and time. She appears well-developed and well-nourished. No distress.  HENT:  Head: Normocephalic and atraumatic.  Right Ear: External ear normal.  Left Ear: External ear normal.  Mouth/Throat: Oropharyngeal exudate present.  Eyes: Pupils are equal, round, and reactive to light.  Neck: Normal range of motion. Neck supple. No thyromegaly present.  Cardiovascular: Normal rate, regular rhythm, normal heart sounds and intact distal pulses.   No murmur heard. Pulmonary/Chest: Effort normal and breath sounds normal. No respiratory distress. She has no wheezes.  Abdominal: Soft. Bowel sounds are normal. She exhibits no distension. There is no tenderness.  Musculoskeletal: Normal range of motion. She exhibits no edema or tenderness.  Neurological: She is alert and oriented to person, place, and time. She has normal reflexes. No cranial nerve deficit.  Skin: Skin is warm and dry.  Psychiatric: She has a normal mood and affect. Her behavior is normal. Judgment and thought content normal.  Vitals reviewed.     BP 127/79 mmHg  Pulse 119  Temp(Src) 101.4 F (38.6 C) (Oral)  Ht  (1.702 m)  Wt 170 lb 3.2 oz (77.202 kg)  BMI 26.65 kg/m2     Assessment & Plan:  1. Flu-like symptoms - POCT rapid strep A - POCT Influenza A/B  2. Acute upper respiratory infection -- Take meds as prescribed - Use a cool mist humidifier  -Use saline nose sprays frequently -Saline irrigations of the nose can be very helpful if done frequently.  * 4X daily for 1 week*  * Use of a nettie pot can be helpful with this. Follow directions with  this* -Force fluids -For any cough or congestion  Use plain Mucinex- regular strength or max strength is fine   * Children- consult with Pharmacist for dosing -For fever or aces or pains- take tylenol or ibuprofen appropriate for age and weight.  * for fevers greater than  101 orally you may alternate ibuprofen and tylenol every  3 hours. -Throat lozenges if help -New toothbrush in 3 days  - amoxicillin-clavulanate (AUGMENTIN) 875-125 MG tablet; Take 1 tablet by mouth 2 (two) times daily.  Dispense: 14 tablet; Refill: 0  Jannifer Rodneyhristy Mahayla Haddaway, FNP

## 2016-01-03 ENCOUNTER — Telehealth: Payer: Self-pay | Admitting: Nurse Practitioner

## 2016-01-03 NOTE — Telephone Encounter (Signed)
denied °

## 2016-02-14 ENCOUNTER — Other Ambulatory Visit: Payer: Self-pay | Admitting: *Deleted

## 2016-02-14 ENCOUNTER — Telehealth: Payer: Self-pay | Admitting: Nurse Practitioner

## 2016-02-14 DIAGNOSIS — J4521 Mild intermittent asthma with (acute) exacerbation: Secondary | ICD-10-CM

## 2016-02-14 MED ORDER — BUDESONIDE-FORMOTEROL FUMARATE 160-4.5 MCG/ACT IN AERO: INHALATION_SPRAY | RESPIRATORY_TRACT | Status: DC

## 2016-02-14 NOTE — Telephone Encounter (Signed)
Pt aware one sample of symbicort left up front.

## 2016-02-14 NOTE — Addendum Note (Signed)
Addended byDory Peru on: 02/14/2016 11:56 AM   Modules accepted: Orders

## 2016-02-14 NOTE — Telephone Encounter (Signed)
Call given to nurse °

## 2016-08-28 ENCOUNTER — Telehealth: Payer: Self-pay | Admitting: Nurse Practitioner

## 2016-09-13 ENCOUNTER — Other Ambulatory Visit: Payer: Self-pay | Admitting: Nurse Practitioner

## 2016-09-13 DIAGNOSIS — J4521 Mild intermittent asthma with (acute) exacerbation: Secondary | ICD-10-CM

## 2016-09-18 ENCOUNTER — Telehealth: Payer: Self-pay | Admitting: Nurse Practitioner

## 2016-09-18 NOTE — Telephone Encounter (Signed)
Please address for MMM 

## 2016-09-20 MED ORDER — FLUTICASONE FUROATE-VILANTEROL 100-25 MCG/INH IN AEPB: 1.0000 | INHALATION_SPRAY | Freq: Every day | RESPIRATORY_TRACT | 6 refills | Status: DC

## 2016-09-20 NOTE — Telephone Encounter (Signed)
Medication changed

## 2016-12-13 ENCOUNTER — Other Ambulatory Visit: Payer: Self-pay | Admitting: *Deleted

## 2016-12-13 MED ORDER — FLUTICASONE FUROATE-VILANTEROL 100-25 MCG/INH IN AEPB: 1.0000 | INHALATION_SPRAY | Freq: Every day | RESPIRATORY_TRACT | 0 refills | Status: DC

## 2016-12-25 ENCOUNTER — Ambulatory Visit: Payer: BLUE CROSS/BLUE SHIELD | Admitting: Physician Assistant

## 2016-12-25 ENCOUNTER — Other Ambulatory Visit: Payer: Self-pay

## 2016-12-25 MED ORDER — ALBUTEROL SULFATE HFA 108 (90 BASE) MCG/ACT IN AERS: 2.0000 | INHALATION_SPRAY | Freq: Four times a day (QID) | RESPIRATORY_TRACT | 3 refills | Status: DC | PRN

## 2017-04-20 ENCOUNTER — Ambulatory Visit (INDEPENDENT_AMBULATORY_CARE_PROVIDER_SITE_OTHER): Payer: Managed Care, Other (non HMO) | Admitting: Family Medicine

## 2017-04-20 VITALS — BP 130/82 | HR 107 | Temp 97.7°F | Ht 67.0 in | Wt 171.0 lb

## 2017-04-20 DIAGNOSIS — R490 Dysphonia: Secondary | ICD-10-CM | POA: Diagnosis not present

## 2017-04-20 MED ORDER — PREDNISONE 10 MG (21) PO TBPK
ORAL_TABLET | Freq: Every day | ORAL | 0 refills | Status: DC
Start: 2017-04-20 — End: 2017-05-21

## 2017-04-20 NOTE — Progress Notes (Signed)
   HPI  Patient presents today here with hoarseness.  Patient describes 7 days of illness. 3 days ago she was seen at urgent care and treated with IM corticosteroids, doxycycline. She states that she feels better in general but has had worsening hoarseness since that time.  Patient has a history of asthma. She is breathing easily without severe cough.  Patient's tolerating doxycycline well.  She is tolerating food and fluids normally. Denies chest pain, shortness of breath, fevers,  Chills, or sweats.  PMH: Smoking status noted ROS: Per HPI  Objective: BP 130/82   Pulse (!) 107   Temp 97.7 F (36.5 C) (Oral)   Ht  (1.702 m)   Wt 171 lb (77.6 kg)   BMI 26.78 kg/m  Gen: NAD, alert, cooperative with exam HEENT: NCAT, oropharynx moist and clear with normal-appearing tonsils, TMs normal bilaterally, nares with swollen turbinates and boggy mucosa CV: RRR, good S1/S2, no murmur Resp: CTABL, no wheezes, non-labored Ext: No edema, warm Neuro: Alert and oriented, No gross deficits  Hoarseness of voice on exam  Assessment and plan:  # Hoarseness Patient currently being covered with doxycycline for atypical pneumonia, described as bronchitis by urgent care Patient is arty received IM steroids 3 days ago. Given short course of prednisone with taper on top of systemic steroids arty given. Recommended starting Flonase for controlling postnasal drip which could be causing laryngeal irritation. Return to clinic with any concerns.   Meds ordered this encounter  Medications  . Levonorgestrel (KYLEENA) 19.5 MG IUD    Sig: by Intrauterine route.  . predniSONE (STERAPRED UNI-PAK 21 TAB) 10 MG (21) TBPK tablet    Sig: Take by mouth daily. As directed x 6 days    Dispense:  21 tablet    Refill:  0    Murtis Sink, MD [facility] 04/20/2017, 12:01 PM

## 2017-04-20 NOTE — Patient Instructions (Signed)
Great to see you!  Try the prednisone, finish all of the doxycyline.   Also start flonase 2 sprays per nostril once daily

## 2017-05-21 ENCOUNTER — Encounter: Payer: Self-pay | Admitting: Nurse Practitioner

## 2017-05-21 ENCOUNTER — Other Ambulatory Visit: Payer: Self-pay

## 2017-05-21 ENCOUNTER — Ambulatory Visit (INDEPENDENT_AMBULATORY_CARE_PROVIDER_SITE_OTHER): Payer: Managed Care, Other (non HMO) | Admitting: Nurse Practitioner

## 2017-05-21 VITALS — BP 117/73 | HR 85 | Temp 97.4°F | Ht 67.0 in | Wt 180.0 lb

## 2017-05-21 DIAGNOSIS — J301 Allergic rhinitis due to pollen: Secondary | ICD-10-CM | POA: Diagnosis not present

## 2017-05-21 DIAGNOSIS — J040 Acute laryngitis: Secondary | ICD-10-CM | POA: Diagnosis not present

## 2017-05-21 MED ORDER — FLUTICASONE FUROATE-VILANTEROL 100-25 MCG/INH IN AEPB: 1.0000 | INHALATION_SPRAY | Freq: Every day | RESPIRATORY_TRACT | 0 refills | Status: DC

## 2017-05-21 MED ORDER — CETIRIZINE HCL 10 MG PO TABS: 10.0000 mg | ORAL_TABLET | Freq: Every day | ORAL | 0 refills | Status: DC

## 2017-05-21 NOTE — Progress Notes (Signed)
   Subjective:    Patient ID: Misty Butler, female    DOB: 1991-04-27, 26 y.o.   MRN: [id number]  HPI Patient in today to discuss her voice. SHe had pneumonia 1 month ago and lost her voisce and has not completely come  Since pneumonia resolved. Her ears are popping. She google her voice on computer and she is now convinced she has throat cancers.    Review of Systems  Constitutional: Negative.   HENT: Positive for voice change. Negative for congestion and sore throat.   Respiratory: Negative for cough and shortness of breath.   Cardiovascular: Negative.   Gastrointestinal: Negative.   Genitourinary: Negative.   Neurological: Negative.   Psychiatric/Behavioral: Negative.   All other systems reviewed and are negative.      Objective:   Physical Exam  Constitutional: She is oriented to person, place, and time. She appears well-nourished. No distress.  HENT:  Right Ear: External ear normal.  Left Ear: External ear normal.  Nose: Nose normal.  Mouth/Throat: Oropharynx is clear and moist.  Voice hoarse  Eyes: Pupils are equal, round, and reactive to light.  Neck: Normal range of motion.  Cardiovascular: Normal rate and regular rhythm.   Pulmonary/Chest: Effort normal and breath sounds normal.  Neurological: She is alert and oriented to person, place, and time.  Skin: Skin is warm.  Psychiatric: She has a normal mood and affect. Her behavior is normal. Judgment and thought content normal.   BP 117/73   Pulse 85   Temp 97.4 F (36.3 C) (Oral)   Ht 5\' 7"  (1.702 m)   Wt 180 lb (81.6 kg)   BMI 28.19 kg/m       Assessment & Plan:  1. Laryngitis - Ambulatory referral to ENT  2. Allergic rhinitis due to pollen, unspecified seasonality Avoid allergens - cetirizine (ZYRTEC) 10 MG tablet; Take 1 tablet (10 mg total) by mouth daily.  Dispense: 30 tablet; Refill: 0   Mary-Margaret Daphine DeutscherMartin, FNP

## 2017-05-21 NOTE — Patient Instructions (Signed)
Allergic Rhinitis Allergic rhinitis is when the mucous membranes in the nose respond to allergens. Allergens are particles in the air that cause your body to have an allergic reaction. This causes you to release allergic antibodies. Through a chain of events, these eventually cause you to release histamine into the blood stream. Although meant to protect the body, it is this release of histamine that causes your discomfort, such as frequent sneezing, congestion, and an itchy, runny nose. What are the causes? Seasonal allergic rhinitis (hay fever) is caused by pollen allergens that may come from grasses, trees, and weeds. Year-round allergic rhinitis (perennial allergic rhinitis) is caused by allergens such as house dust mites, pet dander, and mold spores. What are the signs or symptoms?  Nasal stuffiness (congestion).  Itchy, runny nose with sneezing and tearing of the eyes. How is this diagnosed? Your health care provider can help you determine the allergen or allergens that trigger your symptoms. If you and your health care provider are unable to determine the allergen, skin or blood testing may be used. Your health care provider will diagnose your condition after taking your health history and performing a physical exam. Your health care provider may assess you for other related conditions, such as asthma, pink eye, or an ear infection. How is this treated? Allergic rhinitis does not have a cure, but it can be controlled by:  Medicines that block allergy symptoms. These may include allergy shots, nasal sprays, and oral antihistamines.  Avoiding the allergen. Hay fever may often be treated with antihistamines in pill or nasal spray forms. Antihistamines block the effects of histamine. There are over-the-counter medicines that may help with nasal congestion and swelling around the eyes. Check with your health care provider before taking or giving this medicine. If avoiding the allergen or the  medicine prescribed do not work, there are many new medicines your health care provider can prescribe. Stronger medicine may be used if initial measures are ineffective. Desensitizing injections can be used if medicine and avoidance does not work. Desensitization is when a patient is given ongoing shots until the body becomes less sensitive to the allergen. Make sure you follow up with your health care provider if problems continue. Follow these instructions at home: It is not possible to completely avoid allergens, but you can reduce your symptoms by taking steps to limit your exposure to them. It helps to know exactly what you are allergic to so that you can avoid your specific triggers. Contact a health care provider if:  You have a fever.  You develop a cough that does not stop easily (persistent).  You have shortness of breath.  You start wheezing.  Symptoms interfere with normal daily activities. This information is not intended to replace advice given to you by your health care provider. Make sure you discuss any questions you have with your health care provider. Document Released: 09/11/2001 Document Revised: 08/17/2016 Document Reviewed: 08/24/2013 Elsevier Interactive Patient Education  2017 Elsevier Inc.  

## 2017-06-21 ENCOUNTER — Other Ambulatory Visit: Payer: Self-pay | Admitting: Nurse Practitioner

## 2017-06-21 DIAGNOSIS — J301 Allergic rhinitis due to pollen: Secondary | ICD-10-CM

## 2017-08-20 ENCOUNTER — Other Ambulatory Visit: Payer: Self-pay | Admitting: Nurse Practitioner

## 2017-11-14 ENCOUNTER — Encounter: Payer: Managed Care, Other (non HMO) | Admitting: Nurse Practitioner

## 2018-01-06 ENCOUNTER — Other Ambulatory Visit: Payer: Self-pay | Admitting: Nurse Practitioner

## 2018-01-17 ENCOUNTER — Encounter: Payer: Self-pay | Admitting: Nurse Practitioner

## 2018-01-17 ENCOUNTER — Ambulatory Visit (INDEPENDENT_AMBULATORY_CARE_PROVIDER_SITE_OTHER): Payer: Managed Care, Other (non HMO) | Admitting: Nurse Practitioner

## 2018-01-17 VITALS — BP 149/83 | HR 122 | Temp 97.6°F | Ht 67.0 in | Wt 176.0 lb

## 2018-01-17 DIAGNOSIS — J4530 Mild persistent asthma, uncomplicated: Secondary | ICD-10-CM | POA: Diagnosis not present

## 2018-01-17 MED ORDER — FLUTICASONE-SALMETEROL 113-14 MCG/ACT IN AEPB: 1.0000 | INHALATION_SPRAY | Freq: Two times a day (BID) | RESPIRATORY_TRACT | 5 refills | Status: DC

## 2018-01-17 NOTE — Patient Instructions (Signed)

## 2018-01-17 NOTE — Progress Notes (Signed)
   Subjective:    Patient ID: Misty Butler, female    DOB: 01-11-1991, 27 y.o.   MRN: [id number]  HPI Patient comes in for follow up of asthma- she has been on BREO but insurance is nol onger wanttng to pay for it. They told her she had to try airduo and if that did not work then they would do prior [place] for Weyerha[employer] CompanyBREO.     Review of Systems  Constitutional: Negative.   HENT: Negative.   Respiratory: Negative.   Cardiovascular: Negative.   Gastrointestinal: Negative.   Genitourinary: Negative.   Neurological: Negative.   Psychiatric/Behavioral: Negative.   All other systems reviewed and are negative.      Objective:   Physical Exam  Constitutional: She is oriented to person, place, and time. She appears well-developed and well-nourished. No distress.  Cardiovascular: Normal rate and regular rhythm.  Pulmonary/Chest: Effort normal and breath sounds normal.  Neurological: She is alert and oriented to person, place, and time.  Skin: Skin is warm.  Psychiatric: She has a normal mood and affect. Her behavior is normal. Judgment and thought content normal.    BP (!) 149/83   Pulse (!) 122   Temp 97.6 F (36.4 C) (Oral)   Ht 5\' 7"  (1.702 m)   Wt 176 lb (79.8 kg)   BMI 27.57 kg/m       Assessment & Plan:   1. Mild persistent asthma without complication    Meds ordered this encounter  Medications  . Fluticasone-Salmeterol (AIRDUO RESPICLICK 113/14) 113-14 MCG/ACT AEPB    Sig: Inhale 1 puff into the lungs 2 (two) times daily.    Dispense:  1 each    Refill:  5    Order Specific Question:   Supervising Provider    Answer:   Johna SheriffVINCENT, CAROL L [[id number]]   Patient will let me know   Mary-Margaret Daphine DeutscherMartin, FNP

## 2018-01-21 ENCOUNTER — Telehealth: Payer: Self-pay

## 2018-01-21 ENCOUNTER — Other Ambulatory Visit: Payer: Self-pay | Admitting: Nurse Practitioner

## 2018-01-21 MED ORDER — FLUTICASONE FUROATE-VILANTEROL 100-25 MCG/INH IN AEPB: INHALATION_SPRAY | RESPIRATORY_TRACT | 1 refills | Status: DC

## 2018-01-21 NOTE — Telephone Encounter (Signed)
Airduo respiclick is non formulary  Formulary are [employer]Breo and Advair

## 2018-04-23 ENCOUNTER — Other Ambulatory Visit: Payer: Self-pay | Admitting: Nurse Practitioner

## 2018-08-19 ENCOUNTER — Encounter: Payer: Managed Care, Other (non HMO) | Admitting: Nurse Practitioner

## 2018-09-19 ENCOUNTER — Encounter: Payer: Self-pay | Admitting: Nurse Practitioner

## 2018-09-19 ENCOUNTER — Encounter (INDEPENDENT_AMBULATORY_CARE_PROVIDER_SITE_OTHER): Payer: Self-pay

## 2018-09-19 ENCOUNTER — Ambulatory Visit (INDEPENDENT_AMBULATORY_CARE_PROVIDER_SITE_OTHER): Payer: Managed Care, Other (non HMO) | Admitting: Nurse Practitioner

## 2018-09-19 VITALS — BP 120/74 | HR 76 | Temp 97.9°F | Ht 67.0 in | Wt 161.0 lb

## 2018-09-19 DIAGNOSIS — Z01419 Encounter for gynecological examination (general) (routine) without abnormal findings: Secondary | ICD-10-CM | POA: Diagnosis not present

## 2018-09-19 DIAGNOSIS — J301 Allergic rhinitis due to pollen: Secondary | ICD-10-CM

## 2018-09-19 DIAGNOSIS — F3342 Major depressive disorder, recurrent, in full remission: Secondary | ICD-10-CM

## 2018-09-19 DIAGNOSIS — Z0000 Encounter for general adult medical examination without abnormal findings: Secondary | ICD-10-CM | POA: Diagnosis not present

## 2018-09-19 MED ORDER — FLUTICASONE PROPIONATE 50 MCG/ACT NA SUSP: 2.0000 | Freq: Every day | NASAL | 6 refills | Status: DC

## 2018-09-19 NOTE — Patient Instructions (Signed)

## 2018-09-19 NOTE — Progress Notes (Signed)
Subjective:    Patient ID: Misty Butler, female    DOB: [birth year], 27 y.o.   MRN: [id number]   Chief Complaint: Annual Exam   HPI:  1. Annual physical exam   2. Gynecologic exam normal  Last pap was 02/27/16 and was normal. Does not need today  3. Recurrent major depressive disorder, in partial remission ([place])  is currently on no meds. Says she is doing well. Depression screen [facility] 2/9 09/19/2018 01/17/2018 05/21/2017  Decreased Interest 0 0 0  Down, Depressed, Hopeless 1 0 0  PHQ - 2 Score 1 0 0     4.      Mild persistent asthma          Is on BREO daily. Works well to keep her under control.  Outpatient Encounter Medications as of 09/19/2018  Medication Sig  . cetirizine (ZYRTEC) 10 MG tablet TAKE 1 TABLET BY MOUTH EVERY DAY  . fluticasone furoate-vilanterol (BREO ELLIPTA) 100-25 MCG/INH AEPB TAKE 1 PUFF BY MOUTH EVERY DAY  . Fluticasone-Salmeterol (AIRDUO RESPICLICK 382/50) 539-76 MCG/ACT AEPB Inhale 1 puff into the lungs 2 (two) times daily.  . Levonorgestrel (KYLEENA) 19.5 MG IUD by Intrauterine route.  Marland Kitchen PROAIR HFA 108 (90 Base) MCG/ACT inhaler INHALE 2 PUFFS INTO THE LUNGS EVERY 6 (SIX) HOURS AS NEEDED FOR WHEEZING OR SHORTNESS OF BREATH.    New complaints: None today  Social history:    Review of Systems  Constitutional: Negative for activity change and appetite change.  HENT: Positive for congestion, postnasal drip and rhinorrhea.   Eyes: Negative for pain.  Respiratory: Negative for shortness of breath.   Cardiovascular: Negative for chest pain, palpitations and leg swelling.  Gastrointestinal: Negative for abdominal pain.  Endocrine: Negative for polydipsia.  Genitourinary: Negative.   Skin: Negative for rash.  Neurological: Negative for dizziness, weakness and headaches.  Hematological: Does not bruise/bleed easily.  Psychiatric/Behavioral: Negative.   All other systems reviewed and are negative.      Objective:   Physical Exam  Constitutional:  She is oriented to person, place, and time. She appears well-developed and well-nourished. No distress.  HENT:  Head: Normocephalic.  Nose: Nose normal.  Mouth/Throat: Oropharynx is clear and moist.  Eyes: Pupils are equal, round, and reactive to light. EOM are normal.  Neck: Normal range of motion. Neck supple. No JVD present. Carotid bruit is not present.  Cardiovascular: Normal rate, regular rhythm, normal heart sounds and intact distal pulses.  Pulmonary/Chest: Effort normal and breath sounds normal. No respiratory distress. She has no wheezes. She has no rales. She exhibits no tenderness.  Abdominal: Soft. Normal appearance, normal aorta and bowel sounds are normal. She exhibits no distension, no abdominal bruit, no pulsatile midline mass and no mass. There is no splenomegaly or hepatomegaly. There is no tenderness.  Musculoskeletal: Normal range of motion. She exhibits no edema.  Lymphadenopathy:    She has no cervical adenopathy.  Neurological: She is alert and oriented to person, place, and time. She has normal reflexes.  Skin: Skin is warm and dry.  Psychiatric: She has a normal mood and affect. Her behavior is normal. Judgment and thought content normal.  Nursing note and vitals reviewed.   BP 120/74   Pulse 76   Temp 97.9 F (36.6 C) (Oral)   Ht _0  (1.702 m)   Wt 161 lb (73 kg)   BMI 25.22 kg/m        Assessment & Plan:  Misty Butler comes in today  with chief complaint of Annual Exam   Diagnosis and orders addressed:  1. Annual physical exam Labs pending - CMP14+EGFR - CBC with Differential/Platelet - Lipid panel - Thyroid Panel With TSH  2. Gynecologic exam normal Needs pap in 1 year  3. Recurrent major depressive disorder, in full remission ([place]) Stress management  4. Seasonal allergic rhinitis due to pollen flonase and/or claritin OTC RTO prn Meds ordered this encounter  Medications  . fluticasone (FLONASE) 50 MCG/ACT nasal spray    Sig:  Place 2 sprays into both nostrils daily.    Dispense:  16 g    Refill:  6    Order Specific Question:   Supervising Provider    Answer:   [place] pending Health Maintenance reviewed Diet and exercise encouraged  Follow up plan: 1 year    Mary-Margaret Hassell Done, FNP

## 2018-09-20 LAB — CBC WITH DIFFERENTIAL/PLATELET
BASOS ABS: 0 10*3/uL (ref 0.0–0.2)
Basos: 0 %
EOS (ABSOLUTE): 0.3 10*3/uL (ref 0.0–0.4)
Eos: 5 %
Hematocrit: 41.1 % (ref 34.0–46.6)
Hemoglobin: 13.6 g/dL (ref 11.1–15.9)
IMMATURE GRANULOCYTES: 0 %
Immature Grans (Abs): 0 10*3/uL (ref 0.0–0.1)
Lymphocytes Absolute: 1.5 10*3/uL (ref 0.7–3.1)
Lymphs: 29 %
MCH: 30.1 pg (ref 26.6–33.0)
MCHC: 33.1 g/dL (ref 31.5–35.7)
MCV: 91 fL (ref 79–97)
MONOS ABS: 0.4 10*3/uL (ref 0.1–0.9)
Monocytes: 8 %
NEUTROS PCT: 58 %
Neutrophils Absolute: 3 10*3/uL (ref 1.4–7.0)
Platelets: 158 10*3/uL (ref 150–450)
RBC: 4.52 x10E6/uL (ref 3.77–5.28)
RDW: 12.6 % (ref 12.3–15.4)
WBC: 5.3 10*3/uL (ref 3.4–10.8)

## 2018-09-20 LAB — CMP14+EGFR
ALK PHOS: 70 IU/L (ref 39–117)
ALT: 8 IU/L (ref 0–32)
AST: 12 IU/L (ref 0–40)
Albumin/Globulin Ratio: 1.9 (ref 1.2–2.2)
Albumin: 4.4 g/dL (ref 3.5–5.5)
BUN/Creatinine Ratio: 15 (ref 9–23)
BUN: 10 mg/dL (ref 6–20)
Bilirubin Total: 0.3 mg/dL (ref 0.0–1.2)
CHLORIDE: 104 mmol/L (ref 96–106)
CO2: 24 mmol/L (ref 20–29)
Calcium: 9.2 mg/dL (ref 8.7–10.2)
Creatinine, Ser: 0.68 mg/dL (ref 0.57–1.00)
GFR calc Af Amer: 137 mL/min/{1.73_m2} (ref >59)
GFR calc non Af Amer: 119 mL/min/{1.73_m2} (ref >59)
GLOBULIN, TOTAL: 2.3 g/dL (ref 1.5–4.5)
Glucose: 86 mg/dL (ref 65–99)
Potassium: 4.3 mmol/L (ref 3.5–5.2)
SODIUM: 143 mmol/L (ref 134–144)
Total Protein: 6.7 g/dL (ref 6.0–8.5)

## 2018-09-20 LAB — LIPID PANEL
Chol/HDL Ratio: 3.4 ratio (ref 0.0–4.4)
Cholesterol, Total: 148 mg/dL (ref 100–199)
HDL: 44 mg/dL (ref >39)
LDL CALC: 93 mg/dL (ref 0–99)
Triglycerides: 56 mg/dL (ref 0–149)
VLDL CHOLESTEROL CAL: 11 mg/dL (ref 5–40)

## 2018-09-20 LAB — THYROID PANEL WITH TSH
Free Thyroxine Index: 2.8 (ref 1.2–4.9)
T3 Uptake Ratio: 27 % (ref 24–39)
T4 TOTAL: 10.4 ug/dL (ref 4.5–12.0)
TSH: 0.684 u[IU]/mL (ref 0.450–4.500)

## 2018-12-17 ENCOUNTER — Ambulatory Visit: Payer: Managed Care, Other (non HMO) | Admitting: Family Medicine

## 2019-01-02 ENCOUNTER — Encounter: Payer: Self-pay | Admitting: Nurse Practitioner

## 2019-01-27 ENCOUNTER — Other Ambulatory Visit: Payer: Self-pay | Admitting: Nurse Practitioner

## 2019-01-27 MED ORDER — OSELTAMIVIR PHOSPHATE 75 MG PO CAPS: 75.0000 mg | ORAL_CAPSULE | Freq: Every day | ORAL | 0 refills | Status: AC

## 2019-01-27 NOTE — Progress Notes (Signed)
tamiflu

## 2019-04-19 ENCOUNTER — Other Ambulatory Visit: Payer: Self-pay | Admitting: Nurse Practitioner

## 2019-04-23 ENCOUNTER — Other Ambulatory Visit: Payer: Self-pay

## 2019-04-23 ENCOUNTER — Ambulatory Visit (INDEPENDENT_AMBULATORY_CARE_PROVIDER_SITE_OTHER): Payer: Managed Care, Other (non HMO) | Admitting: Nurse Practitioner

## 2019-04-23 ENCOUNTER — Encounter: Payer: Self-pay | Admitting: Nurse Practitioner

## 2019-04-23 DIAGNOSIS — J4520 Mild intermittent asthma, uncomplicated: Secondary | ICD-10-CM | POA: Diagnosis not present

## 2019-04-23 MED ORDER — FLUTICASONE FUROATE-VILANTEROL 100-25 MCG/INH IN AEPB: INHALATION_SPRAY | RESPIRATORY_TRACT | 1 refills | Status: DC

## 2019-04-23 NOTE — Progress Notes (Signed)
Patient ID: Misty Butler, female   DOB: 09-17-1991, 28 y.o.   MRN: [id number]    Virtual Visit via telephone Note  I connected with Misty Butler on 04/23/19 at 12:00 by telephone and verified that I am speaking with the correct person using two identifiers. Misty Butler is currently located at home and no one is currently with her during visit. The provider, Mary-Margaret Daphine Deutscher, FNP is located in their office at time of visit.  I discussed the limitations, risks, security and privacy concerns of performing an evaluation and management service by telephone and the availability of in person appointments. I also discussed with the patient that there may be a patient responsible charge related to this service. The patient expressed understanding and agreed to proceed.   History and Present Illness:   Chief Complaint: Wheezing   HPI Patient calls in today c/o she is out of BREO and needs a refill. She is wheezing some but really not bothering her. No cough or congestion.     Review of Systems  Constitutional: Negative for chills and fever.  Respiratory: Positive for wheezing (slight). Negative for shortness of breath.   Cardiovascular: Negative.   Genitourinary: Negative.   Neurological: Negative.   Psychiatric/Behavioral: Negative.   All other systems reviewed and are negative.    Observations/Objective: Alert and oriented- answers all questions appropriately  Assessment and Plan: Misty Butler in today with chief complaint of Wheezing   1. Mild intermittent asthma without complication Wear mask when outside - fluticasone furoate-vilanterol (BREO ELLIPTA) 100-25 MCG/INH AEPB; TAKE 1 PUFF BY MOUTH EVERY DAY  Dispense: 180 each; Refill: 1   Follow Up Instructions: prn    I discussed the assessment and treatment plan with the patient. The patient was provided an opportunity to ask questions and all were answered. The patient agreed with the plan and  demonstrated an understanding of the instructions.   The patient was advised to call back or seek an in-person evaluation if the symptoms worsen or if the condition fails to improve as anticipated.  The above assessment and management plan was discussed with the patient. The patient verbalized understanding of and has agreed to the management plan. Patient is aware to call the clinic if symptoms persist or worsen. Patient is aware when to return to the clinic for a follow-up visit. Patient educated on when it is appropriate to go to the emergency department.    I provided 10 minutes of non-face-to-face time during this encounter.    Mary-Margaret Daphine Deutscher, FNP

## 2019-06-10 ENCOUNTER — Telehealth: Payer: Self-pay | Admitting: Nurse Practitioner

## 2019-06-10 ENCOUNTER — Telehealth: Payer: Self-pay | Admitting: *Deleted

## 2019-06-10 DIAGNOSIS — Z20822 Contact with and (suspected) exposure to covid-19: Secondary | ICD-10-CM

## 2019-06-10 NOTE — Telephone Encounter (Signed)
Pt did E Visit with [facility] earlier and pt states that she has symptoms of the Coronavirus according to her E Visit, pt is a current pt of [facility] and pt is wondering if she needs to go be tested

## 2019-06-10 NOTE — Telephone Encounter (Signed)
Pt's my chart encounter response 06/10/2019 triggered BPA; pt advised to follow plan of care as discussed with provider.

## 2019-06-10 NOTE — Telephone Encounter (Signed)
Aware.  [facility] in [place].

## 2019-06-10 NOTE — Progress Notes (Signed)
E-Visit for Corona Virus Screening   Your current symptoms could be consistent with the coronavirus.  Call your health care provider or local health department to request and arrange formal testing. Many health care providers can now test patients at their office but not all are.  Please quarantine yourself while awaiting your test results.  [place] [phone], [place], [place] [phone] or visit [email or web address]  and You have been enrolled in [facility] for COVID-19.  Daily you will receive a questionnaire within the [facility] website. Our COVID-19 response team will be monitoring your responses daily.    COVID-19 is a respiratory illness with symptoms that are similar to the flu. Symptoms are typically mild to moderate, but there have been cases of severe illness and death due to the virus. The following symptoms may appear 2-14 days after exposure: . Fever . Cough . Shortness of breath or difficulty breathing . Chills . Repeated shaking with chills . Muscle pain . Headache . Sore throat . New loss of taste or smell . Fatigue . Congestion or runny nose . Nausea or vomiting . Diarrhea  It is vitally important that if you feel that you have an infection such as this virus or any other virus that you stay home and away from places where you may spread it to others.  You should self-quarantine for 14 days if you have symptoms that could potentially be coronavirus or have been in close contact a with a person diagnosed with COVID-19 within the last 2 weeks. You should avoid contact with people age 21 and older.   You should wear a mask or cloth face covering over your nose and mouth if you must be around other people or animals, including pets (even at home). Try to stay at least 6 feet away from other people. This will protect the  people around you.  You can use medication such as delsym or mucinex if develops cough  You may also take acetaminophen (Tylenol) as needed for fever.   Reduce your risk of any infection by using the same precautions used for avoiding the common cold or flu:  [place] Kitchen Wash your hands often with soap and warm water for at least 20 seconds.  If soap and water are not readily available, use an alcohol-based hand sanitizer with at least 60% alcohol.  . If coughing or sneezing, cover your mouth and nose by coughing or sneezing into the elbow areas of your shirt or coat, into a tissue or into your sleeve (not your hands). . Avoid shaking hands with others and consider head nods or verbal greetings only. . Avoid touching your eyes, nose, or mouth with unwashed hands.  . Avoid close contact with people who are sick. . Avoid places or events with large numbers of people in one location, like concerts or sporting events. . Carefully consider travel plans you have or are making. . If you are planning any travel outside or inside the [place], visit the CDC's Travelers' Health webpage for the latest health notices. . If you have some symptoms but not all symptoms, continue to monitor at home and seek medical attention if your symptoms worsen. . If you are having a medical emergency, call 911.  HOME CARE . Only take medications as instructed by your medical team. . Drink plenty of fluids and get plenty of rest. . A steam or ultrasonic humidifier can help if you have congestion.   GET  HELP RIGHT AWAY IF YOU HAVE EMERGENCY WARNING SIGNS** FOR COVID-19. If you or someone is showing any of these signs seek emergency medical care immediately. Call 911 or proceed to your closest emergency facility if: . You develop worsening high fever. . Trouble breathing . Bluish lips or face . Persistent pain or pressure in the chest . New confusion . Inability to wake or stay awake . You cough up blood. . Your symptoms become  more severe  **This list is not all possible symptoms. Contact your medical provider for any symptoms that are sever or concerning to you.   MAKE SURE YOU   Understand these instructions.  Will watch your condition.  Will get help right away if you are not doing well or get worse.  Your e-visit answers were reviewed by a board certified advanced clinical practitioner to complete your personal care plan.  Depending on the condition, your plan could have included both over the counter or prescription medications.  If there is a problem please reply once you have received a response from your provider.  Your safety is important to us.  If you have drug allergies check your prescription carefully.    You can use MyChart to ask questions about today's visit, request a non-urgent call back, or ask for a work or school excuse for 24 hours related to this e-Visit. If it has been greater than 24 hours you will need to follow up with your provider, or enter a new e-Visit to address those concerns. You will get an e-mail in the next two days asking about your experience.  I hope that your e-visit has been valuable and will speed your recovery. Thank you for using e-visits.   5-10 minutes spent reviewing and documenting in chart.

## 2019-06-11 ENCOUNTER — Encounter (INDEPENDENT_AMBULATORY_CARE_PROVIDER_SITE_OTHER): Payer: Self-pay

## 2019-08-24 ENCOUNTER — Other Ambulatory Visit: Payer: Self-pay | Admitting: Nurse Practitioner

## 2019-09-04 ENCOUNTER — Encounter: Payer: Self-pay | Admitting: Nurse Practitioner

## 2019-09-04 ENCOUNTER — Other Ambulatory Visit: Payer: Self-pay

## 2019-09-04 ENCOUNTER — Ambulatory Visit (INDEPENDENT_AMBULATORY_CARE_PROVIDER_SITE_OTHER): Payer: Managed Care, Other (non HMO) | Admitting: Nurse Practitioner

## 2019-09-04 DIAGNOSIS — F411 Generalized anxiety disorder: Secondary | ICD-10-CM | POA: Diagnosis not present

## 2019-09-04 MED ORDER — ESCITALOPRAM OXALATE 10 MG PO TABS: 10.0000 mg | ORAL_TABLET | Freq: Every day | ORAL | 5 refills | Status: DC

## 2019-09-04 NOTE — Progress Notes (Signed)
Virtual Visit via telephone Note Due to COVID-19 pandemic this visit was conducted virtually. This visit type was conducted due to national recommendations for restrictions regarding the COVID-19 Pandemic (e.g. social distancing, sheltering in place) in an effort to limit this patient's exposure and mitigate transmission in our community. All issues noted in this document were discussed and addressed.  A physical exam was not performed with this format.  I connected with Misty Butler on 09/04/19 at 8:05 by telephone and verified that I am speaking with the correct person using two identifiers. Misty Butler is currently located at home and no one is currently with her during visit. The provider, Mary-Margaret Hassell Done, FNP is located in their office at time of visit.  I discussed the limitations, risks, security and privacy concerns of performing an evaluation and management service by telephone and the availability of in person appointments. I also discussed with the patient that there may be a patient responsible charge related to this service. The patient expressed understanding and agreed to proceed.   History and Present Illness:  Patient calls in today c/o anxiety. She says that for years she has had anxiety which calls her to start and stop smoking. She will stop for several months and then she will start again because of her anxiety. She has not smoked in over 1 month and her husband just got layed off from work.   GAD 7 : Generalized Anxiety Score 09/04/2019  Nervous, Anxious, on Edge 2  Control/stop worrying 3  Worry too much - different things 3  Trouble relaxing 2  Restless 3  Easily annoyed or irritable 3  Afraid - awful might happen 1  Total GAD 7 Score 17  Anxiety Difficulty Very difficult    Depression screen [facility] 2/9 09/04/2019 09/19/2018 01/17/2018  Decreased Interest 0 0 0  Down, Depressed, Hopeless 0 1 0  PHQ - 2 Score 0 1 0      Review of Systems   Constitutional: Negative for diaphoresis and weight loss.  Eyes: Negative for blurred vision, double vision and pain.  Respiratory: Negative for shortness of breath.   Cardiovascular: Negative for chest pain, palpitations, orthopnea and leg swelling.  Gastrointestinal: Negative for abdominal pain.  Skin: Negative for rash.  Neurological: Negative for dizziness, sensory change, loss of consciousness, weakness and headaches.  Endo/Heme/Allergies: Negative for polydipsia. Does not bruise/bleed easily.  Psychiatric/Behavioral: Negative for memory loss. The patient does not have insomnia.   All other systems reviewed and are negative.    Observations/Objective: Alert and oriented- answers all questions appropriately No distress    Assessment and Plan: Misty Butler in today with chief complaint of Medical Management of Chronic Issues   1. GAD (generalized anxiety disorder) Stress management Exercise Eat 3 balanced meals a day - escitalopram (LEXAPRO) 10 MG tablet; Take 1 tablet (10 mg total) by mouth daily.  Dispense: 30 tablet; Refill: 5   Follow Up Instructions: Prn and in 2 months    I discussed the assessment and treatment plan with the patient. The patient was provided an opportunity to ask questions and all were answered. The patient agreed with the plan and demonstrated an understanding of the instructions.   The patient was advised to call back or seek an in-person evaluation if the symptoms worsen or if the condition fails to improve as anticipated.  The above assessment and management plan was discussed with the patient. The patient verbalized understanding of and has agreed to the management  plan. Patient is aware to call the clinic if symptoms persist or worsen. Patient is aware when to return to the clinic for a follow-up visit. Patient educated on when it is appropriate to go to the emergency department.   Time call ended:  8:25  I provided 20 minutes of  non-face-to-face time during this encounter.    Mary-Margaret Daphine DeutscherMartin, FNP

## 2019-09-29 ENCOUNTER — Other Ambulatory Visit: Payer: Self-pay

## 2019-09-30 ENCOUNTER — Ambulatory Visit (INDEPENDENT_AMBULATORY_CARE_PROVIDER_SITE_OTHER): Payer: Managed Care, Other (non HMO) | Admitting: Nurse Practitioner

## 2019-09-30 ENCOUNTER — Encounter: Payer: Self-pay | Admitting: Nurse Practitioner

## 2019-09-30 VITALS — BP 124/75 | HR 82 | Temp 98.6°F | Resp 16 | Ht 67.0 in | Wt 179.0 lb

## 2019-09-30 DIAGNOSIS — Z0001 Encounter for general adult medical examination with abnormal findings: Secondary | ICD-10-CM | POA: Diagnosis not present

## 2019-09-30 DIAGNOSIS — J4520 Mild intermittent asthma, uncomplicated: Secondary | ICD-10-CM | POA: Diagnosis not present

## 2019-09-30 DIAGNOSIS — F3342 Major depressive disorder, recurrent, in full remission: Secondary | ICD-10-CM

## 2019-09-30 DIAGNOSIS — Z23 Encounter for immunization: Secondary | ICD-10-CM | POA: Diagnosis not present

## 2019-09-30 DIAGNOSIS — Z0000 Encounter for general adult medical examination without abnormal findings: Secondary | ICD-10-CM

## 2019-09-30 MED ORDER — VIIBRYD 20 MG PO TABS: 20.0000 mg | ORAL_TABLET | Freq: Every day | ORAL | 5 refills | Status: DC

## 2019-09-30 NOTE — Progress Notes (Signed)
Subjective:    Patient ID: Misty Butler, female    DOB: 21-Feb-1991, 28 y.o.   MRN: [id number]   Chief Complaint: Annual Exam    HPI:  1. Annual physical exam Wants lab work  2. Recurrent major depressive disorder, in full remission ([place]) She has stopped her lexapro because she was having night sweats and she thought it was coming from her lexapro. She has stopped lexapro and is still having some night sweats.  Depression screen [facility] 2/9 09/30/2019 09/04/2019 09/19/2018  Decreased Interest 0 0 0  Down, Depressed, Hopeless 0 0 1  PHQ - 2 Score 0 0 1     3. Mild intermittent asthma without complication Is on breo and has had no recent flare ups    Outpatient Encounter Medications as of 09/30/2019  Medication Sig  . albuterol (VENTOLIN HFA) 108 (90 Base) MCG/ACT inhaler TAKE 2 PUFFS BY MOUTH EVERY 6 HOURS AS NEEDED FOR WHEEZE OR SHORTNESS OF BREATH  . cetirizine (ZYRTEC) 10 MG tablet TAKE 1 TABLET BY MOUTH EVERY DAY  . escitalopram (LEXAPRO) 10 MG tablet Take 1 tablet (10 mg total) by mouth daily.  . fluticasone (FLONASE) 50 MCG/ACT nasal spray Place 2 sprays into both nostrils daily.  . fluticasone furoate-vilanterol (BREO ELLIPTA) 100-25 MCG/INH AEPB TAKE 1 PUFF BY MOUTH EVERY DAY  . Fluticasone-Salmeterol (AIRDUO RESPICLICK 989/21) 194-17 MCG/ACT AEPB Inhale 1 puff into the lungs 2 (two) times daily.  . Levonorgestrel (KYLEENA) 19.5 MG IUD by Intrauterine route.     History reviewed. No pertinent surgical history.  Family History  Problem Relation Age of Onset  . Migraines Mother   . Gout Father   . Autism Sister     New complaints: Has been keeping an intermittent sore throat and right sid efelt swollen  Social history: Her husband was layed off from his job- he is interviewing for new jobs  Controlled substance contract: n/a    Review of Systems  Constitutional: Negative for activity change and appetite change.  HENT: Negative.   Eyes: Negative for pain.   Respiratory: Negative for shortness of breath.   Cardiovascular: Negative for chest pain, palpitations and leg swelling.  Gastrointestinal: Negative for abdominal pain.  Endocrine: Negative for polydipsia.  Genitourinary: Negative.   Skin: Negative for rash.  Neurological: Negative for dizziness, weakness and headaches.  Hematological: Does not bruise/bleed easily.  Psychiatric/Behavioral: Negative.   All other systems reviewed and are negative.      Objective:   Physical Exam Vitals signs and nursing note reviewed.  Constitutional:      General: She is not in acute distress.    Appearance: Normal appearance. She is well-developed.  HENT:     Head: Normocephalic.     Nose: Nose normal.  Eyes:     Pupils: Pupils are equal, round, and reactive to light.  Neck:     Musculoskeletal: Normal range of motion and neck supple.     Vascular: No carotid bruit or JVD.  Cardiovascular:     Rate and Rhythm: Normal rate and regular rhythm.     Heart sounds: Normal heart sounds.  Pulmonary:     Effort: Pulmonary effort is normal. No respiratory distress.     Breath sounds: Normal breath sounds. No wheezing or rales.  Chest:     Chest wall: No tenderness.  Abdominal:     General: Bowel sounds are normal. There is no distension or abdominal bruit.     Palpations: Abdomen is soft. There  is no hepatomegaly, splenomegaly, mass or pulsatile mass.     Tenderness: There is no abdominal tenderness.  Musculoskeletal: Normal range of motion.  Lymphadenopathy:     Cervical: No cervical adenopathy.  Skin:    General: Skin is warm and dry.  Neurological:     Mental Status: She is alert and oriented to person, place, and time.     Deep Tendon Reflexes: Reflexes are normal and symmetric.  Psychiatric:        Behavior: Behavior normal.        Thought Content: Thought content normal.        Judgment: Judgment normal.     BP 124/75   Pulse 82   Temp 98.6 F (37 C) (Oral)   Resp 16   Ht _0   (1.702 m)   Wt 179 lb (81.2 kg)   SpO2 99%   BMI 28.04 kg/m        Assessment & Plan:  Misty Butler comes in today with chief complaint of Annual Exam   Diagnosis and orders addressed:  1. Annual physical exam - CBC with Differential/Platelet - CMP14+EGFR - Lipid panel - Thyroid Panel With TSH  2. Recurrent major depressive disorder, in full remission ([place]) Going to try her on viibryd and see if works - Vilazodone HCl (VIIBRYD) 20 MG TABS; Take 1 tablet (20 mg total) by mouth daily.  Dispense: 30 tablet; Refill: 5  3. Mild intermittent asthma without complication breo as rx   Labs pending Health Maintenance reviewed Diet and exercise encouraged  Follow up plan: 6 months   [place], FNP

## 2019-09-30 NOTE — Patient Instructions (Signed)

## 2019-10-01 LAB — CMP14+EGFR
ALT: 12 IU/L (ref 0–32)
AST: 13 IU/L (ref 0–40)
Albumin/Globulin Ratio: 2.3 — ABNORMAL HIGH (ref 1.2–2.2)
Albumin: 4.5 g/dL (ref 3.9–5.0)
Alkaline Phosphatase: 77 IU/L (ref 39–117)
BUN/Creatinine Ratio: 15 (ref 9–23)
BUN: 11 mg/dL (ref 6–20)
Bilirubin Total: 0.2 mg/dL (ref 0.0–1.2)
CO2: 23 mmol/L (ref 20–29)
Calcium: 9.5 mg/dL (ref 8.7–10.2)
Chloride: 103 mmol/L (ref 96–106)
Creatinine, Ser: 0.73 mg/dL (ref 0.57–1.00)
GFR calc Af Amer: 128 mL/min/{1.73_m2} (ref >59)
GFR calc non Af Amer: 111 mL/min/{1.73_m2} (ref >59)
Globulin, Total: 2 g/dL (ref 1.5–4.5)
Glucose: 90 mg/dL (ref 65–99)
Potassium: 3.7 mmol/L (ref 3.5–5.2)
Sodium: 140 mmol/L (ref 134–144)
Total Protein: 6.5 g/dL (ref 6.0–8.5)

## 2019-10-01 LAB — LIPID PANEL
Chol/HDL Ratio: 4.2 ratio (ref 0.0–4.4)
Cholesterol, Total: 197 mg/dL (ref 100–199)
HDL: 47 mg/dL (ref >39)
LDL Chol Calc (NIH): 121 mg/dL — ABNORMAL HIGH (ref 0–99)
Triglycerides: 163 mg/dL — ABNORMAL HIGH (ref 0–149)
VLDL Cholesterol Cal: 29 mg/dL (ref 5–40)

## 2019-10-01 LAB — THYROID PANEL WITH TSH
Free Thyroxine Index: 2.5 (ref 1.2–4.9)
T3 Uptake Ratio: 25 % (ref 24–39)
T4, Total: 10.1 ug/dL (ref 4.5–12.0)
TSH: 0.894 u[IU]/mL (ref 0.450–4.500)

## 2019-10-01 LAB — CBC WITH DIFFERENTIAL/PLATELET
Basophils Absolute: 0 10*3/uL (ref 0.0–0.2)
Basos: 0 %
EOS (ABSOLUTE): 0.4 10*3/uL (ref 0.0–0.4)
Eos: 5 %
Hematocrit: 40 % (ref 34.0–46.6)
Hemoglobin: 13.9 g/dL (ref 11.1–15.9)
Immature Grans (Abs): 0 10*3/uL (ref 0.0–0.1)
Immature Granulocytes: 0 %
Lymphocytes Absolute: 2.1 10*3/uL (ref 0.7–3.1)
Lymphs: 28 %
MCH: 30.7 pg (ref 26.6–33.0)
MCHC: 34.8 g/dL (ref 31.5–35.7)
MCV: 88 fL (ref 79–97)
Monocytes Absolute: 0.5 10*3/uL (ref 0.1–0.9)
Monocytes: 6 %
Neutrophils Absolute: 4.7 10*3/uL (ref 1.4–7.0)
Neutrophils: 61 %
Platelets: 181 10*3/uL (ref 150–450)
RBC: 4.53 x10E6/uL (ref 3.77–5.28)
RDW: 11.7 % (ref 11.7–15.4)
WBC: 7.7 10*3/uL (ref 3.4–10.8)

## 2019-10-27 ENCOUNTER — Other Ambulatory Visit: Payer: Self-pay | Admitting: Nurse Practitioner

## 2019-10-27 DIAGNOSIS — F411 Generalized anxiety disorder: Secondary | ICD-10-CM

## 2019-10-29 ENCOUNTER — Other Ambulatory Visit: Payer: Self-pay

## 2019-10-30 ENCOUNTER — Encounter: Payer: Self-pay | Admitting: Family Medicine

## 2019-10-30 ENCOUNTER — Ambulatory Visit: Payer: Managed Care, Other (non HMO) | Admitting: Family Medicine

## 2019-10-30 ENCOUNTER — Other Ambulatory Visit: Payer: Self-pay | Admitting: Nurse Practitioner

## 2019-10-30 VITALS — BP 115/76 | HR 76 | Temp 98.7°F | Ht 67.0 in | Wt 181.4 lb

## 2019-10-30 DIAGNOSIS — J4520 Mild intermittent asthma, uncomplicated: Secondary | ICD-10-CM

## 2019-10-30 DIAGNOSIS — K2101 Gastro-esophageal reflux disease with esophagitis, with bleeding: Secondary | ICD-10-CM | POA: Diagnosis not present

## 2019-10-30 DIAGNOSIS — K921 Melena: Secondary | ICD-10-CM

## 2019-10-30 MED ORDER — OMEPRAZOLE 20 MG PO CPDR: 20.0000 mg | DELAYED_RELEASE_CAPSULE | Freq: Two times a day (BID) | ORAL | 1 refills | Status: DC

## 2019-10-30 NOTE — Progress Notes (Signed)
BP 115/76   Pulse 76   Temp 98.7 F (37.1 C) (Temporal)   Ht 5\' 7"  (1.702 m)   Wt 181 lb 6.4 oz (82.3 kg)   LMP 10/28/2019 (Exact Date)   SpO2 95%   BMI 28.41 kg/m    Subjective:   Patient ID: Misty Butler, female    DOB: 06/21/1991, 28 y.o.   MRN: [id number]  HPI: Misty Butler is a 28 y.o. female presenting on 10/30/2019 for Blood In Stools (x 5 days. family hx of colon cancer) and Abdominal Pain (upper abd- states she feels always full)   HPI Patient is coming in with blood in her stool is been going on for 5 days.  She has been having some upper abdominal pain for a little bit longer than that and then she does admit that she has been fighting GERD for quite some time, she did do 2 weeks worth of omeprazole a month and a half ago and it did help some but not completely but she still has been fighting this and now the blood in the stool is new.  It was not darker red but it was still red and not just black.  She feels bloated and has some upper abdominal pressure but denies any thing that she would cause sharp pains.  Relevant past medical, surgical, family and social history reviewed and updated as indicated. Interim medical history since our last visit reviewed. Allergies and medications reviewed and updated.  Review of Systems  Constitutional: Negative for chills and fever.  Eyes: Negative for visual disturbance.  Respiratory: Negative for chest tightness and shortness of breath.   Cardiovascular: Negative for chest pain and leg swelling.  Gastrointestinal: Positive for abdominal distention, abdominal pain and blood in stool. Negative for constipation, diarrhea, nausea and vomiting.  Musculoskeletal: Negative for back pain and gait problem.  Skin: Negative for rash.  Neurological: Negative for light-headedness and headaches.  Psychiatric/Behavioral: Negative for agitation and behavioral problems.  All other systems reviewed and are negative.   Per HPI unless  specifically indicated above   Allergies as of 10/30/2019      Reactions   Zithromax [azithromycin]       Medication List       Accurate as of October 30, 2019  3:37 PM. If you have any questions, ask your nurse or doctor.        STOP taking these medications   escitalopram 10 MG tablet Commonly known as: LEXAPRO Stopped by: November 01, 2019 Yeila Morro, MD     TAKE these medications   albuterol 108 (90 Base) MCG/ACT inhaler Commonly known as: VENTOLIN HFA TAKE 2 PUFFS BY MOUTH EVERY 6 HOURS AS NEEDED FOR WHEEZE OR SHORTNESS OF BREATH   Breo Ellipta 100-25 MCG/INH Aepb Generic drug: fluticasone furoate-vilanterol INHALE 1 PUFF BY MOUTH EVERY DAY What changed: See the new instructions. Changed by: Elige Radon, FNP   cetirizine 10 MG tablet Commonly known as: ZYRTEC TAKE 1 TABLET BY MOUTH EVERY DAY   fluticasone 50 MCG/ACT nasal spray Commonly known as: FLONASE Place 2 sprays into both nostrils daily.   Fluticasone-Salmeterol 113-14 MCG/ACT Aepb Commonly known as: AirDuo RespiClick 113/14 Inhale 1 puff into the lungs 2 (two) times daily.   Kyleena 19.5 MG Iud Generic drug: Levonorgestrel by Intrauterine route.   Viibryd 20 MG Tabs Generic drug: Vilazodone HCl Take 1 tablet (20 mg total) by mouth daily.        Objective:   BP 115/76  Pulse 76   Temp 98.7 F (37.1 C) (Temporal)   Ht 5\' 7"  (1.702 m)   Wt 181 lb 6.4 oz (82.3 kg)   LMP 10/28/2019 (Exact Date)   SpO2 95%   BMI 28.41 kg/m   Wt Readings from Last 3 Encounters:  10/30/19 181 lb 6.4 oz (82.3 kg)  09/30/19 179 lb (81.2 kg)  09/19/18 161 lb (73 kg)    Physical Exam Vitals signs and nursing note reviewed.  Constitutional:      General: She is not in acute distress.    Appearance: She is well-developed. She is not diaphoretic.  Eyes:     Conjunctiva/sclera: Conjunctivae normal.  Cardiovascular:     Rate and Rhythm: Normal rate and regular rhythm.     Heart sounds: Normal heart sounds.  No murmur.  Pulmonary:     Effort: Pulmonary effort is normal. No respiratory distress.     Breath sounds: Normal breath sounds. No wheezing.  Abdominal:     Tenderness: There is abdominal tenderness in the epigastric area. There is no right CVA tenderness, left CVA tenderness, guarding or rebound. Negative signs include Murphy's sign and McBurney's sign.  Genitourinary:    Rectum: Normal.  Skin:    General: Skin is warm and dry.     Findings: No rash.  Neurological:     Mental Status: She is alert and oriented to person, place, and time.     Coordination: Coordination normal.  Psychiatric:        Behavior: Behavior normal.       Assessment & Plan:   Problem List Items Addressed This Visit    None    Visit Diagnoses    Gastroesophageal reflux disease with esophagitis and hemorrhage    -  Primary   Relevant Medications   omeprazole (PRILOSEC) 20 MG capsule   Other Relevant Orders   CBC with Differential/Platelet   Ambulatory referral to Gastroenterology   Blood in stool       Relevant Medications   omeprazole (PRILOSEC) 20 MG capsule   Other Relevant Orders   CBC with Differential/Platelet    Patient would like to go ahead and do referral to gastroenterology because of family history of young colon cancer in the low 35s.  We will check CBC today. Recommend start omeprazole twice a day for a month and follow-up with PCP in 1 month  Follow up plan: Return in about 4 weeks (around 11/27/2019), or if symptoms worsen or fail to improve, for Follow-up GERD and blood in stool.  Counseling provided for all of the vaccine components No orders of the defined types were placed in this encounter.   Caryl Pina, MD [facility] 10/30/2019, 3:37 PM

## 2019-10-31 LAB — CBC WITH DIFFERENTIAL/PLATELET
Basophils Absolute: 0 10*3/uL (ref 0.0–0.2)
Basos: 0 %
EOS (ABSOLUTE): 0.2 10*3/uL (ref 0.0–0.4)
Eos: 2 %
Hematocrit: 42.5 % (ref 34.0–46.6)
Hemoglobin: 14.5 g/dL (ref 11.1–15.9)
Immature Grans (Abs): 0 10*3/uL (ref 0.0–0.1)
Immature Granulocytes: 1 %
Lymphocytes Absolute: 2 10*3/uL (ref 0.7–3.1)
Lymphs: 27 %
MCH: 30.5 pg (ref 26.6–33.0)
MCHC: 34.1 g/dL (ref 31.5–35.7)
MCV: 90 fL (ref 79–97)
Monocytes Absolute: 0.4 10*3/uL (ref 0.1–0.9)
Monocytes: 6 %
Neutrophils Absolute: 4.9 10*3/uL (ref 1.4–7.0)
Neutrophils: 64 %
Platelets: 188 10*3/uL (ref 150–450)
RBC: 4.75 x10E6/uL (ref 3.77–5.28)
RDW: 11.7 % (ref 11.7–15.4)
WBC: 7.6 10*3/uL (ref 3.4–10.8)

## 2019-11-04 ENCOUNTER — Encounter: Payer: Self-pay | Admitting: Gastroenterology

## 2019-11-22 ENCOUNTER — Encounter: Payer: Self-pay | Admitting: Gastroenterology

## 2019-11-22 NOTE — Progress Notes (Deleted)
Referring Provider: Dr. Ivin Booty Dettinger Primary Care Physician:  Bennie Pierini, FNP Primary Gastroenterologist:  Dr. Darrick Penna  No chief complaint on file.   HPI:   Misty Butler is a 28 y.o. female presenting today at the request of Dr. Ivin Booty Dettinger for GERD with esophagitis and hemorrhage.***  Reviewed recent PCP note dated 10/30/2019.  Patient was complaining of blood in her stool x5 days, upper abdominal pain for a little bit longer, and GERD symptoms for "quite some time."  She had tried 2 weeks of omeprazole a little over a month prior that helped some but did not resolve symptoms completely.  Also felt bloated with upper abdominal pressure.  Patient was placed on omeprazole 20 mg daily, CBC was ordered, and referral to GI was placed.  CBC with hemoglobin 14.5 (up from 13.9 in September 2020).  Today she states   Past Medical History:  Diagnosis Date  . Asthma     No past surgical history on file.  Current Outpatient Medications  Medication Sig Dispense Refill  . albuterol (VENTOLIN HFA) 108 (90 Base) MCG/ACT inhaler TAKE 2 PUFFS BY MOUTH EVERY 6 HOURS AS NEEDED FOR WHEEZE OR SHORTNESS OF BREATH 18 g 0  . BREO ELLIPTA 100-25 MCG/INH AEPB INHALE 1 PUFF BY MOUTH EVERY DAY 180 each 1  . cetirizine (ZYRTEC) 10 MG tablet TAKE 1 TABLET BY MOUTH EVERY DAY 30 tablet 4  . fluticasone (FLONASE) 50 MCG/ACT nasal spray Place 2 sprays into both nostrils daily. 16 g 6  . Fluticasone-Salmeterol (AIRDUO RESPICLICK 113/14) 113-14 MCG/ACT AEPB Inhale 1 puff into the lungs 2 (two) times daily. 1 each 5  . Levonorgestrel (KYLEENA) 19.5 MG IUD by Intrauterine route.    Marland Kitchen omeprazole (PRILOSEC) 20 MG capsule Take 1 capsule (20 mg total) by mouth 2 (two) times daily before a meal. 60 capsule 1  . Vilazodone HCl (VIIBRYD) 20 MG TABS Take 1 tablet (20 mg total) by mouth daily. 30 tablet 5   No current facility-administered medications for this visit.     Allergies as of 11/23/2019  - Review Complete 10/30/2019  Allergen Reaction Noted  . Zithromax [azithromycin]  06/02/2013    Family History  Problem Relation Age of Onset  . Migraines Mother   . Gout Father   . Autism Sister     Social History   Socioeconomic History  . Marital status: Married    Spouse name: Not on file  . Number of children: Not on file  . Years of education: Not on file  . Highest education level: Not on file  Occupational History  . Not on file  Social Needs  . Financial resource strain: Not on file  . Food insecurity    Worry: Not on file    Inability: Not on file  . Transportation needs    Medical: Not on file    Non-medical: Not on file  Tobacco Use  . Smoking status: Former Games developer  . Smokeless tobacco: Never Used  Substance and Sexual Activity  . Alcohol use: Yes    Alcohol/week: 1.0 standard drinks    Types: 1 Glasses of wine per week  . Drug use: No  . Sexual activity: Not on file  Lifestyle  . Physical activity    Days per week: Not on file    Minutes per session: Not on file  . Stress: Not on file  Relationships  . Social connections    Talks on phone: Not on file  Gets together: Not on file    Attends religious service: Not on file    Active member of club or organization: Not on file    Attends meetings of clubs or organizations: Not on file    Relationship status: Not on file  . Intimate partner violence    Fear of current or ex partner: Not on file    Emotionally abused: Not on file    Physically abused: Not on file    Forced sexual activity: Not on file  Other Topics Concern  . Not on file  Social History Narrative  . Not on file    Review of Systems: Gen: Denies any fever, chills, fatigue, weight loss, lack of appetite.  CV: Denies chest pain, heart palpitations, peripheral edema, syncope.  Resp: Denies shortness of breath at rest or with exertion. Denies wheezing or cough.  GI: Denies dysphagia or odynophagia. Denies jaundice, hematemesis,  fecal incontinence. GU : Denies urinary burning, urinary frequency, urinary hesitancy MS: Denies joint pain, muscle weakness, cramps, or limitation of movement.  Derm: Denies rash, itching, dry skin Psych: Denies depression, anxiety, memory loss, and confusion Heme: Denies bruising, bleeding, and enlarged lymph nodes.  Physical Exam: LMP 10/28/2019 (Exact Date)  General:   Alert and oriented. Pleasant and cooperative. Well-nourished and well-developed.  Head:  Normocephalic and atraumatic. Eyes:  Without icterus, sclera clear and conjunctiva pink.  Ears:  Normal auditory acuity. Nose:  No deformity, discharge,  or lesions. Mouth:  No deformity or lesions, oral mucosa pink.  Neck:  Supple, without mass or thyromegaly. Lungs:  Clear to auscultation bilaterally. No wheezes, rales, or rhonchi. No distress.  Heart:  S1, S2 present without murmurs appreciated.  Abdomen:  +BS, soft, non-tender and non-distended. No HSM noted. No guarding or rebound. No masses appreciated.  Rectal:  Deferred  Msk:  Symmetrical without gross deformities. Normal posture. Pulses:  Normal pulses noted. Extremities:  Without clubbing or edema. Neurologic:  Alert and  oriented x4;  grossly normal neurologically. Skin:  Intact without significant lesions or rashes. Cervical Nodes:  No significant cervical adenopathy. Psych:  Alert and cooperative. Normal mood and affect.

## 2019-11-23 ENCOUNTER — Ambulatory Visit: Payer: Self-pay | Admitting: Gastroenterology

## 2019-12-07 ENCOUNTER — Encounter: Payer: Self-pay | Admitting: Nurse Practitioner

## 2019-12-07 ENCOUNTER — Ambulatory Visit (INDEPENDENT_AMBULATORY_CARE_PROVIDER_SITE_OTHER): Payer: Managed Care, Other (non HMO) | Admitting: Nurse Practitioner

## 2019-12-07 DIAGNOSIS — K2101 Gastro-esophageal reflux disease with esophagitis, with bleeding: Secondary | ICD-10-CM

## 2019-12-07 MED ORDER — PANTOPRAZOLE SODIUM 40 MG PO TBEC: 40.0000 mg | DELAYED_RELEASE_TABLET | Freq: Every day | ORAL | 3 refills | Status: DC

## 2019-12-07 NOTE — Progress Notes (Signed)
Virtual Visit via telephone Note Due to COVID-19 pandemic this visit was conducted virtually. This visit type was conducted due to national recommendations for restrictions regarding the COVID-19 Pandemic (e.g. social distancing, sheltering in place) in an effort to limit this patient's exposure and mitigate transmission in our community. All issues noted in this document were discussed and addressed.  A physical exam was not performed with this format.  I connected with Misty Butler on 12/07/19 at 10:15 by telephone and verified that I am speaking with the correct person using two identifiers. Misty Butler is currently located at home and no one is currently with her during visit. The provider, Mary-Margaret Hassell Done, FNP is located in their office at time of visit.  I discussed the limitations, risks, security and privacy concerns of performing an evaluation and management service by telephone and the availability of in person appointments. I also discussed with the patient that there may be a patient responsible charge related to this service. The patient expressed understanding and agreed to proceed.   History and Present Illness:  Chief Complaint: Blood In Stools   HPI Patient calls in c/o of some strange symptoms. She ha been having some gastric reflux ad she thinks that is bothering her asthma. She is having to use her rescue inhaler daily. She has had some chest pain when she has reflux.she has had some bright red blood in er stools. She is constipated at times.    Review of Systems  Constitutional: Negative for diaphoresis and weight loss.  Eyes: Negative for blurred vision, double vision and pain.  Respiratory: Negative for shortness of breath.   Cardiovascular: Negative for chest pain, palpitations, orthopnea and leg swelling.  Gastrointestinal: Negative for abdominal pain.  Skin: Negative for rash.  Neurological: Negative for dizziness, sensory change, loss of  consciousness, weakness and headaches.  Endo/Heme/Allergies: Negative for polydipsia. Does not bruise/bleed easily.  Psychiatric/Behavioral: Negative for memory loss. The patient does not have insomnia.   All other systems reviewed and are negative.    Observations/Objective: Alert and oriented- answers all questions appropriately No distress    Assessment and Plan: SWEET JARVIS in today with chief complaint of Blood In Stools   1. Gastroesophageal reflux disease with esophagitis and hemorrhage Avoid spicy foods Do not eat 2 hours prior to bedtime Meds ordered this encounter  Medications  . pantoprazole (PROTONIX) 40 MG tablet    Sig: Take 1 tablet (40 mg total) by mouth daily.    Dispense:  30 tablet    Refill:  3    Order Specific Question:   Supervising Provider    Answer:   Caryl Pina A [[id number]]      Follow Up Instructions: prn    I discussed the assessment and treatment plan with the patient. The patient was provided an opportunity to ask questions and all were answered. The patient agreed with the plan and demonstrated an understanding of the instructions.   The patient was advised to call back or seek an in-person evaluation if the symptoms worsen or if the condition fails to improve as anticipated.  The above assessment and management plan was discussed with the patient. The patient verbalized understanding of and has agreed to the management plan. Patient is aware to call the clinic if symptoms persist or worsen. Patient is aware when to return to the clinic for a follow-up visit. Patient educated on when it is appropriate to go to the emergency department.   Time call  ended:  10:28 I provided 13 minutes of non-face-to-face time during this encounter.    Mary-Margaret Daphine Deutscher, FNP

## 2019-12-10 ENCOUNTER — Other Ambulatory Visit: Payer: Self-pay | Admitting: Nurse Practitioner

## 2019-12-10 DIAGNOSIS — F3342 Major depressive disorder, recurrent, in full remission: Secondary | ICD-10-CM

## 2019-12-10 NOTE — Telephone Encounter (Signed)
Pharmacy comment: REQUEST FOR 90 DAYS PRESCRIPTION.

## 2019-12-13 NOTE — Progress Notes (Deleted)
Referring Provider: Dettinger, Elige Radon, MD Primary Care Physician:  Bennie Pierini, FNP Primary Gastroenterologist:  Dr. Darrick Penna  No chief complaint on file.   HPI:   Misty Butler is a 28 y.o. female presenting today at the request of Dettinger, Elige Radon, MD for reflux.   Reviewed recent PCP notes: 10/30/2019: Patient reported blood in her stool x5 days, upper abdominal pain a little longer than that, and GERD symptoms for quite some time.  Tried 2 weeks worth of omeprazole 1.5 months prior to office visit with mild improvement.  Note states patient preferred referral to GI because of family history of young colon cancer in the low 40s.  Plans to check CBC and restart omeprazole 20 mg twice daily.  CBC within normal limits.  12/07/2019: Patient presented for follow-up with chief complaint of blood in her stools.  Reported GERD symptoms bothering her asthma, bright red blood per rectum, constipation at times.  She was prescribed Protonix 40 mg daily.  Today:     Past Medical History:  Diagnosis Date  . Asthma   . Depression     No past surgical history on file.  Current Outpatient Medications  Medication Sig Dispense Refill  . VIIBRYD 20 MG TABS TAKE 1 TABLET BY MOUTH EVERY DAY 90 tablet 1  . albuterol (VENTOLIN HFA) 108 (90 Base) MCG/ACT inhaler TAKE 2 PUFFS BY MOUTH EVERY 6 HOURS AS NEEDED FOR WHEEZE OR SHORTNESS OF BREATH 18 g 0  . BREO ELLIPTA 100-25 MCG/INH AEPB INHALE 1 PUFF BY MOUTH EVERY DAY 180 each 1  . cetirizine (ZYRTEC) 10 MG tablet TAKE 1 TABLET BY MOUTH EVERY DAY 30 tablet 4  . fluticasone (FLONASE) 50 MCG/ACT nasal spray Place 2 sprays into both nostrils daily. 16 g 6  . Fluticasone-Salmeterol (AIRDUO RESPICLICK 113/14) 113-14 MCG/ACT AEPB Inhale 1 puff into the lungs 2 (two) times daily. 1 each 5  . Levonorgestrel (KYLEENA) 19.5 MG IUD by Intrauterine route.    . pantoprazole (PROTONIX) 40 MG tablet Take 1 tablet (40 mg total) by mouth daily. 30  tablet 3   No current facility-administered medications for this visit.    Allergies as of 12/14/2019 - Review Complete 12/07/2019  Allergen Reaction Noted  . Zithromax [azithromycin]  06/02/2013    Family History  Problem Relation Age of Onset  . Migraines Mother   . Gout Father   . Autism Sister     Social History   Socioeconomic History  . Marital status: Married    Spouse name: Not on file  . Number of children: Not on file  . Years of education: Not on file  . Highest education level: Not on file  Occupational History  . Not on file  Tobacco Use  . Smoking status: Former Games developer  . Smokeless tobacco: Never Used  Substance and Sexual Activity  . Alcohol use: Yes    Alcohol/week: 1.0 standard drinks    Types: 1 Glasses of wine per week  . Drug use: No  . Sexual activity: Not on file  Other Topics Concern  . Not on file  Social History Narrative  . Not on file   Social Determinants of Health   Financial Resource Strain:   . Difficulty of Paying Living Expenses: Not on file  Food Insecurity:   . Worried About Programme researcher, broadcasting/film/video in the Last Year: Not on file  . Ran Out of Food in the Last Year: Not on file  Transportation Needs:   .  Lack of Transportation (Medical): Not on file  . Lack of Transportation (Non-Medical): Not on file  Physical Activity:   . Days of Exercise per Week: Not on file  . Minutes of Exercise per Session: Not on file  Stress:   . Feeling of Stress : Not on file  Social Connections:   . Frequency of Communication with Friends and Family: Not on file  . Frequency of Social Gatherings with Friends and Family: Not on file  . Attends Religious Services: Not on file  . Active Member of Clubs or Organizations: Not on file  . Attends Archivist Meetings: Not on file  . Marital Status: Not on file  Intimate Partner Violence:   . Fear of Current or Ex-Partner: Not on file  . Emotionally Abused: Not on file  . Physically Abused:  Not on file  . Sexually Abused: Not on file    Review of Systems: Gen: Denies any fever, chills, fatigue, weight loss, lack of appetite.  CV: Denies chest pain, heart palpitations, peripheral edema, syncope.  Resp: Denies shortness of breath at rest or with exertion. Denies wheezing or cough.  GI: Denies dysphagia or odynophagia. Denies jaundice, hematemesis, fecal incontinence. GU : Denies urinary burning, urinary frequency, urinary hesitancy MS: Denies joint pain, muscle weakness, cramps, or limitation of movement.  Derm: Denies rash, itching, dry skin Psych: Denies depression, anxiety, memory loss, and confusion Heme: Denies bruising, bleeding, and enlarged lymph nodes.  Physical Exam: There were no vitals taken for this visit. General:   Alert and oriented. Pleasant and cooperative. Well-nourished and well-developed.  Head:  Normocephalic and atraumatic. Eyes:  Without icterus, sclera clear and conjunctiva pink.  Ears:  Normal auditory acuity. Nose:  No deformity, discharge,  or lesions. Mouth:  No deformity or lesions, oral mucosa pink.  Neck:  Supple, without mass or thyromegaly. Lungs:  Clear to auscultation bilaterally. No wheezes, rales, or rhonchi. No distress.  Heart:  S1, S2 present without murmurs appreciated.  Abdomen:  +BS, soft, non-tender and non-distended. No HSM noted. No guarding or rebound. No masses appreciated.  Rectal:  Deferred  Msk:  Symmetrical without gross deformities. Normal posture. Pulses:  Normal pulses noted. Extremities:  Without clubbing or edema. Neurologic:  Alert and  oriented x4;  grossly normal neurologically. Skin:  Intact without significant lesions or rashes. Cervical Nodes:  No significant cervical adenopathy. Psych:  Alert and cooperative. Normal mood and affect.

## 2019-12-14 ENCOUNTER — Other Ambulatory Visit: Payer: Self-pay | Admitting: Nurse Practitioner

## 2019-12-14 ENCOUNTER — Telehealth: Payer: Self-pay | Admitting: Nurse Practitioner

## 2019-12-14 ENCOUNTER — Ambulatory Visit: Payer: Self-pay | Admitting: Gastroenterology

## 2019-12-14 MED ORDER — ERYTHROMYCIN 5 MG/GM OP OINT: 1.0000 "application " | TOPICAL_OINTMENT | Freq: Every day | OPHTHALMIC | 0 refills | Status: DC

## 2019-12-15 ENCOUNTER — Other Ambulatory Visit: Payer: Self-pay | Admitting: Family Medicine

## 2019-12-15 DIAGNOSIS — K2101 Gastro-esophageal reflux disease with esophagitis, with bleeding: Secondary | ICD-10-CM

## 2019-12-15 DIAGNOSIS — K921 Melena: Secondary | ICD-10-CM

## 2019-12-17 NOTE — Progress Notes (Signed)
Referring Provider: Dettinger, Fransisca Kaufmann, MD Primary Care Physician:  Chevis Pretty, FNP Primary Gastroenterologist:  Dr. Oneida Alar  Chief Complaint  Patient presents with  . Gastroesophageal Reflux    changed to pantoprazole 2 weeks ago and it is improving  . Blood In Stools    2 months ago for 2 weeks. Last episode was 3 weeks ago and last for 3 days    HPI:   Misty Butler is a 28 y.o. female presenting today at the request of Dettinger, Fransisca Kaufmann, MD for GERD.   Original referral on 10/30/19. Patient no showed to her first visit.  PCP note dated 10/30/19: Reported blood in her stool x 5 days, upper abdominal pain for a little longer that that, and GERD symptoms for a while. She completed 2 weeks of omeprazole 1.5 months prior to visit which helped some. Also reported family history of colon cancer. She was prescribed omeprazole 20 mg BID. CBC ordered.  CBC with hemoglobin 14.5. Follow-up with PCP on 12/07/19 reporting reflux symptoms, blood in her stools, and constipation. She was prescribed Protonix 40 mg daily.   Today:   GERD: Taking Protonix 40 mg daily. States she has had GERD for years. Would take medications for short term. Typically omeprazole OTC. Main symptom was hoarseness and was diagnosed by ENT. More recently, she would have chest pain and worsening asthma symptoms. Not specifically feeling reflux or burning in chest. Now that she is on protonix, her symptoms are much improved. Chest pain once in the last 2 weeks. Was occurring daily. No dysphagia. No nausea or vomiting. Occasional upper abdominal pain. About 3-4 times a week after eating. This has not improved with protonix. Always RUQ, sometimes radiates over to LUQ or RLQ. Last about 1-2 hours. More of a dull period cramp feeling. Still has her gallbladder. No imaging of her abdomen. Trying to eat healthier. Trying not to eat fast foods. Had gained 30 lbs this year. Running again. Limiting fried foods. No soda.  Limiting spicy foods. Taking ibuprofen about once a week. Typically 400-600 mg.   Rectal Bleeding: Bright red blood per rectum started 2 months ago.  Occurred for about 2 weeks then stop for short time.  Occurred again about 3 weeks ago for 3 days and stopped.  Blood in the stool. Looked like stool was coated. Mostly has been drips of blood into the toilet. Was seen by PCP. They checked for hemorrhoids and was told she didn't have hemorrhoids or tare. No rectal pain. No rectal burning, itching, or irritation. No constipation. Stools are loose/mushy. Stools are smaller in caliber than they used to be. BMs daily. About 3-4 times a day, usually in the morning. These are small stools where as she used to have a good large BM in the past. Not watery. No lower abdominal cramping. No specifically postprandial stools. No antibiotics. No nocturnal stools. Stool change has been for the last 6 months. No known lactose intolerance. Drinks coffee. Eats cheese fairly regularly. Grandfather has celiac disease. Husband has wheat allergy so her wheat consumption is minimal. 2 great aunts (one maternal and one paternal) have passed from colon cancer in their 59s.    Past Medical History:  Diagnosis Date  . Asthma   . Depression     History reviewed. No pertinent surgical history.  Current Outpatient Medications  Medication Sig Dispense Refill  . albuterol (VENTOLIN HFA) 108 (90 Base) MCG/ACT inhaler TAKE 2 PUFFS BY MOUTH EVERY 6 HOURS AS NEEDED FOR  WHEEZE OR SHORTNESS OF BREATH 18 g 0  . BREO ELLIPTA 100-25 MCG/INH AEPB INHALE 1 PUFF BY MOUTH EVERY DAY 180 each 1  . cetirizine (ZYRTEC) 10 MG tablet TAKE 1 TABLET BY MOUTH EVERY DAY 30 tablet 4  . erythromycin ophthalmic ointment Place 1 application into both eyes at bedtime. (Patient taking differently: Place 1 application into both eyes at bedtime. As needed) 3.5 g 0  . fluticasone (FLONASE) 50 MCG/ACT nasal spray Place 2 sprays into both nostrils daily. (Patient  taking differently: Place 2 sprays into both nostrils as needed. ) 16 g 6  . Levonorgestrel (KYLEENA) 19.5 MG IUD by Intrauterine route.    . pantoprazole (PROTONIX) 40 MG tablet Take 1 tablet (40 mg total) by mouth daily. 30 tablet 3  . VIIBRYD 20 MG TABS TAKE 1 TABLET BY MOUTH EVERY DAY 90 tablet 1  . CLENPIQ 10-3.5-12 MG-GM -GM/160ML SOLN Take 1 kit by mouth once for 1 dose. 320 mL 0   No current facility-administered medications for this visit.    Allergies as of 12/18/2019 - Review Complete 12/18/2019  Allergen Reaction Noted  . Zithromax [azithromycin]  06/02/2013    Family History  Problem Relation Age of Onset  . Migraines Mother   . Gout Father   . Autism Sister   . Colon cancer Other        passed in 37s  . Colon cancer Other        passed in 39s    Social History   Socioeconomic History  . Marital status: Married    Spouse name: Not on file  . Number of children: Not on file  . Years of education: Not on file  . Highest education level: Not on file  Occupational History  . Not on file  Tobacco Use  . Smoking status: Former Research scientist (life sciences)  . Smokeless tobacco: Never Used  Substance and Sexual Activity  . Alcohol use: Yes    Comment: 3 drinks per week (red wine)  . Drug use: No  . Sexual activity: Not on file  Other Topics Concern  . Not on file  Social History Narrative  . Not on file   Social Determinants of Health   Financial Resource Strain:   . Difficulty of Paying Living Expenses: Not on file  Food Insecurity:   . Worried About Charity fundraiser in the Last Year: Not on file  . Ran Out of Food in the Last Year: Not on file  Transportation Needs:   . Lack of Transportation (Medical): Not on file  . Lack of Transportation (Non-Medical): Not on file  Physical Activity:   . Days of Exercise per Week: Not on file  . Minutes of Exercise per Session: Not on file  Stress:   . Feeling of Stress : Not on file  Social Connections:   . Frequency of  Communication with Friends and Family: Not on file  . Frequency of Social Gatherings with Friends and Family: Not on file  . Attends Religious Services: Not on file  . Active Member of Clubs or Organizations: Not on file  . Attends Archivist Meetings: Not on file  . Marital Status: Not on file  Intimate Partner Violence:   . Fear of Current or Ex-Partner: Not on file  . Emotionally Abused: Not on file  . Physically Abused: Not on file  . Sexually Abused: Not on file    Review of Systems: Gen: Denies any fever, chills, lightheadedness, dizziness,  presyncope, or syncope. CV: Denies other chest pain or heart palpitations Resp: Denies shortness of breath or cough GI: See HPI GU : Denies urinary burning, urinary frequency, urinary hesitancy MS: Denies joint pain Derm: Denies rash Psych: Denies depression, anxiety Heme: See HPI  Physical Exam: BP 112/67   Pulse 71   Temp 97.6 F (36.4 C) (Oral)   Ht _0  (1.702 m)   Wt 189 lb 3.2 oz (85.8 kg)   LMP 12/15/2019   BMI 29.63 kg/m  General:   Alert and oriented. Pleasant and cooperative. Well-nourished and well-developed.  Head:  Normocephalic and atraumatic. Eyes:  Without icterus, sclera clear and conjunctiva pink.  Ears:  Normal auditory acuity. Lungs:  Clear to auscultation bilaterally. No wheezes, rales, or rhonchi. No distress.  Heart:  S1, S2 present without murmurs appreciated.  Abdomen:  +BS, soft, non-distended. Tenderness to palpation in the right upper quadrant. No HSM noted. No guarding or rebound. No masses appreciated.  Rectal:  Deferred  Msk:  Symmetrical without gross deformities. Normal posture. Extremities:  Without edema. Neurologic:  Alert and  oriented x4;  grossly normal neurologically. Skin:  Intact without significant lesions or rashes. Psych: Normal mood and affect.

## 2019-12-18 ENCOUNTER — Encounter: Payer: Self-pay | Admitting: Gastroenterology

## 2019-12-18 ENCOUNTER — Telehealth: Payer: Self-pay | Admitting: Gastroenterology

## 2019-12-18 ENCOUNTER — Other Ambulatory Visit: Payer: Self-pay

## 2019-12-18 ENCOUNTER — Other Ambulatory Visit: Payer: Self-pay | Admitting: *Deleted

## 2019-12-18 ENCOUNTER — Ambulatory Visit: Payer: Managed Care, Other (non HMO) | Admitting: Gastroenterology

## 2019-12-18 ENCOUNTER — Encounter: Payer: Self-pay | Admitting: *Deleted

## 2019-12-18 VITALS — BP 112/67 | HR 71 | Temp 97.6°F | Ht 67.0 in | Wt 189.2 lb

## 2019-12-18 DIAGNOSIS — R1011 Right upper quadrant pain: Secondary | ICD-10-CM | POA: Diagnosis not present

## 2019-12-18 DIAGNOSIS — K219 Gastro-esophageal reflux disease without esophagitis: Secondary | ICD-10-CM

## 2019-12-18 DIAGNOSIS — K625 Hemorrhage of anus and rectum: Secondary | ICD-10-CM | POA: Diagnosis not present

## 2019-12-18 DIAGNOSIS — R195 Other fecal abnormalities: Secondary | ICD-10-CM

## 2019-12-18 MED ORDER — CLENPIQ 10-3.5-12 MG-GM -GM/160ML PO SOLN: 1.0000 | Freq: Once | ORAL | 0 refills | Status: AC

## 2019-12-18 NOTE — Telephone Encounter (Signed)
Patient called back. Spouse unable to take her for procedure on 1/4. Patient has switched appt to 1/5 at 2:00pm. Went over new instructions with her. Called endo and LMOVM with new appt.

## 2019-12-18 NOTE — Telephone Encounter (Signed)
LMOVM for pt 

## 2019-12-18 NOTE — Assessment & Plan Note (Addendum)
28 year old female with new onset of rectal bleeding 2 months ago.  Blood in the stool and dripping into toilet water.  First episode lasted 2 weeks and resolved.  Occurred again about 3 weeks ago and lasted for 3 days.  She did not use anything to help with bleeding.  Reports seeing PCP who performed rectal exam and stated she did not have hemorrhoids or tear.  She denies rectal pain or other rectal irritation.  No constipation.  She reports change in stool caliber and consistency over the last 6 months with thinner stools and having 3-4 loose/mushy stools in the morning rather than 1 large BM.  Denies abdominal pain or unintentional weight loss.  Grandfather has celiac disease.  2 great aunts have passed from colon cancer in their 71s.  CBC on file after initial episode of 2 weeks rectal bleeding with hemoglobin normal at 14.5.  CMP and TSH within normal limits in September 2020.  It is possible that rectal bleeding is from a benign anorectal source such as hemorrhoids; however, cannot rule out colon polyps or malignancy.  Unclear source of change in stool caliber or stool consistency.  Will check celiac serologies and advised patient to follow lactose-free diet. She will need colonoscopy for further evaluation.  Proceed with TCS with propofol with Dr. Oneida Alar in the near future.The risks, benefits, and alternatives have been discussed in detail with patient. They have stated understanding and desire to proceed.  Propofol due to patient's age and Viibryd She is advised to call if she had return of significant rectal bleeding prior to her procedure. IgA total, TTG IgA Follow lactose-free diet or use Lactaid prior to consuming dairy products. Follow-up after colonoscopy.

## 2019-12-18 NOTE — Assessment & Plan Note (Addendum)
Postprandial right upper quadrant pain occurring 3-4 times a week lasting 1-2 hours at a time.  Patient does have history of GERD and was recently started on Protonix which has improved her GERD symptoms but abdominal pain has persisted. Abdominal exam with tenderness to palpation in the right upper quadrant. Query whether patient is experiencing biliary colic.  Proceed with right upper quadrant abdominal ultrasound to evaluate further.  Further recommendations to follow.

## 2019-12-18 NOTE — Assessment & Plan Note (Addendum)
Patient reporting change in her stools over the last 6 months.  Stool caliber has decreased and she is now having 3-4 loose/mushy stools in the morning rather than 1 large BM in the morning.  Also reporting rectal bleeding that started 2 months ago.  Initially lasted for 2 weeks then resolved.  Returned 3 weeks ago and lasted for 3 days. She did not use anything to help with bleeding.  Reports seeing PCP who performed rectal exam and stated she did not have hemorrhoids or tear.  She denies rectal pain or other rectal irritation.  No constipation, abdominal pain, or unintentional weight loss.  No nocturnal stools or recent antibiotics. No known lactose intolerance but she does eat cheese regularly.  Grandfather has celiac disease.  2 great aunts have passed from colon cancer in their 7s.  CBC on file from 10/03/2019 with hemoglobin normal, 14.5.  CMP and TSH normal in September 2020.  Unclear source of change in stool caliber and stool consistency.  With new onset of rectal bleeding along with family history of colon cancer, patient needs colonoscopy for further evaluation to rule out underlying malignancy.  We will also evaluate for celiac disease.  Patient advised to follow a lactose-free diet.  Proceed with TCS with propofol with Dr. Oneida Alar in the near future. The risks, benefits, and alternatives have been discussed in detail with patient. They have stated understanding and desire to proceed.  Propofol due to patient's age and Viibryd She is advised to call if she had return of significant rectal bleeding prior to her procedure. IgA total, TTG IgA Follow lactose-free diet or use Lactaid prior to consuming dairy products. Follow-up after colonoscopy.

## 2019-12-18 NOTE — Telephone Encounter (Signed)
Pt was calling back about scheduling her procedure. [phone]

## 2019-12-18 NOTE — Assessment & Plan Note (Addendum)
28 year old female reporting chronic history of GERD using OTC omeprazole intermittently over the last several years.  She was recently started on Protonix 40 mg daily and has tried to start eating healthier and work on weight loss as she had gained 30 pounds this year.  Symptoms are much improved at this time.  She does admit to postprandial right upper quadrant abdominal pain 3-4 times a week lasting 1-2 hours at a time that has not improved with Protonix.  Query whether patient may be experiencing biliary colic.  Otherwise, no significant upper GI symptoms.  Suspect her GERD symptoms are related to dietary habits and weight gain.  Continue Protonix 40 mg daily 30 minutes before breakfast. Reinforced GERD diet and lifestyle.  Handout provided. Advise she continue to work on weight loss. Right upper quadrant ultrasound to evaluate gallbladder etiology of abdominal pain. Further recommendations to follow.

## 2019-12-18 NOTE — Patient Instructions (Signed)
We will be scheduled for colonoscopy in the near future with Dr. Oneida Alar to evaluate your rectal bleeding.  Please have labs and ultrasound completed.  For your GERD symptoms: Continue Protonix 40 mg daily 30 minutes before breakfast. Avoid fried, fatty, greasy, spicy, and citrus foods.  Avoid caffeine, carbonated beverages, and chocolate.  Do not eat within 3 hours of laying down.  See handout below. Continue working on weight loss.  For rectal bleeding: We are scheduling you for the colonoscopy. If you have return of significant rectal bleeding, please call.  Abdominal pain: We are helping to arrange an ultrasound of your gallbladder to evaluate this further.  Loose stools: Follow lactose-free diet or take Lactaid pills prior to consuming dairy products. We are checking you for celiac disease.  We will plan to follow-up with you in the office after your colonoscopy.  Call if you have questions or concerns prior.  Aliene Altes, PA-C [facility]

## 2019-12-18 NOTE — Telephone Encounter (Signed)
Patient aware pre-op/covid testing appt details

## 2019-12-23 ENCOUNTER — Ambulatory Visit (HOSPITAL_COMMUNITY)
Admission: RE | Admit: 2019-12-23 | Discharge: 2019-12-23 | Disposition: A | Discharge status: Home / Self Care | Payer: Managed Care, Other (non HMO) | Source: Ambulatory Visit | Attending: Gastroenterology | Admitting: Gastroenterology

## 2019-12-23 ENCOUNTER — Other Ambulatory Visit: Payer: Self-pay

## 2019-12-23 DIAGNOSIS — R1011 Right upper quadrant pain: Secondary | ICD-10-CM | POA: Insufficient documentation

## 2019-12-24 LAB — TISSUE TRANSGLUTAMINASE, IGA: (tTG) Ab, IgA: 1 U/mL

## 2019-12-24 LAB — IGA: Immunoglobulin A: 155 mg/dL (ref 47–310)

## 2019-12-27 NOTE — Progress Notes (Signed)
RUQ [place] within normal limits. No evidence of gallstones. Liver appears normal. Can we follow-up with patient to see if she is still having RUQ abdominal pain or if this has improved?

## 2019-12-27 NOTE — Progress Notes (Signed)
No evidence of celiac disease

## 2019-12-31 NOTE — Patient Instructions (Signed)
   Your procedure is scheduled on: 01/05/2020  Report to [facility] at     12:30 PM.  Call this number if you have problems the morning of surgery: [phone]   Remember:              Follow Directions on the letter you received from Your Physician's office regarding the Bowel Prep  :  Take these medicines the morning of surgery with A SIP OF WATER: Protonix, allegra, flonase and use inhalers if needed   Do not wear jewelry, make-up or nail polish.    Do not bring valuables to the hospital.  Contacts, dentures or bridgework may not be worn into surgery.  .   Patients discharged the day of surgery will not be allowed to drive home.     Colonoscopy, Adult, Care After This sheet gives you information about how to care for yourself after your procedure. Your health care provider may also give you more specific instructions. If you have problems or questions, contact your health care provider. What can I expect after the procedure? After the procedure, it is common to have:  A small amount of blood in your stool for 24 hours after the procedure.  Some gas.  Mild abdominal cramping or bloating.  Follow these instructions at home: General instructions   For the first 24 hours after the procedure: ? Do not drive or use machinery. ? Do not sign important documents. ? Do not drink alcohol. ? Do your regular daily activities at a slower pace than normal. ? Eat soft, easy-to-digest foods. ? Rest often.  Take over-the-counter or prescription medicines only as told by your health care provider.  It is up to you to get the results of your procedure. Ask your health care provider, or the department performing the procedure, when your results will be ready. Relieving cramping and bloating  Try walking around when you have cramps or feel bloated.  Apply heat to your abdomen as told by your health care provider. Use a heat source that your health care provider recommends, such as a  moist heat pack or a heating pad. ? Place a towel between your skin and the heat source. ? Leave the heat on for 20-30 minutes. ? Remove the heat if your skin turns bright red. This is especially important if you are unable to feel pain, heat, or cold. You may have a greater risk of getting burned. Eating and drinking  Drink enough fluid to keep your urine clear or pale yellow.  Resume your normal diet as instructed by your health care provider. Avoid heavy or fried foods that are hard to digest.  Avoid drinking alcohol for as long as instructed by your health care provider. Contact a health care provider if:  You have blood in your stool 2-3 days after the procedure. Get help right away if:  You have more than a small spotting of blood in your stool.  You pass large blood clots in your stool.  Your abdomen is swollen.  You have nausea or vomiting.  You have a fever.  You have increasing abdominal pain that is not relieved with medicine. This information is not intended to replace advice given to you by your health care provider. Make sure you discuss any questions you have with your health care provider. Document Released: 07/31/2004 Document Revised: 09/10/2016 Document Reviewed: 02/28/2016 Elsevier Interactive Patient Education  [employer].

## 2020-01-02 ENCOUNTER — Other Ambulatory Visit: Payer: Self-pay | Admitting: Family Medicine

## 2020-01-02 DIAGNOSIS — R3989 Other symptoms and signs involving the genitourinary system: Secondary | ICD-10-CM

## 2020-01-02 MED ORDER — NITROFURANTOIN MONOHYD MACRO 100 MG PO CAPS: 100.0000 mg | ORAL_CAPSULE | Freq: Two times a day (BID) | ORAL | 0 refills | Status: AC

## 2020-01-02 NOTE — Progress Notes (Signed)
Called on call provider with c/o urgency, frequency, dysuria, and lower abdominal pressure for 2 days. No fever, chills, confusion, weakness, fatigue, hematuria, or flank pain.

## 2020-01-04 ENCOUNTER — Encounter (HOSPITAL_COMMUNITY): Payer: Self-pay

## 2020-01-04 ENCOUNTER — Other Ambulatory Visit (HOSPITAL_COMMUNITY)
Admission: RE | Admit: 2020-01-04 | Discharge: 2020-01-04 | Disposition: A | Discharge status: Home / Self Care | Payer: Managed Care, Other (non HMO) | Source: Ambulatory Visit | Attending: Gastroenterology | Admitting: Gastroenterology

## 2020-01-04 ENCOUNTER — Other Ambulatory Visit: Payer: Self-pay

## 2020-01-04 ENCOUNTER — Telehealth: Payer: Self-pay | Admitting: Gastroenterology

## 2020-01-04 ENCOUNTER — Encounter (HOSPITAL_COMMUNITY)
Admission: RE | Admit: 2020-01-04 | Discharge: 2020-01-04 | Disposition: A | Discharge status: Home / Self Care | Payer: Managed Care, Other (non HMO) | Source: Ambulatory Visit | Attending: Gastroenterology | Admitting: Gastroenterology

## 2020-01-04 DIAGNOSIS — Z01812 Encounter for preprocedural laboratory examination: Secondary | ICD-10-CM | POA: Diagnosis present

## 2020-01-04 DIAGNOSIS — K633 Ulcer of intestine: Secondary | ICD-10-CM | POA: Insufficient documentation

## 2020-01-04 DIAGNOSIS — K649 Unspecified hemorrhoids: Secondary | ICD-10-CM | POA: Insufficient documentation

## 2020-01-04 HISTORY — DX: Family history of other specified conditions: Z84.89

## 2020-01-04 HISTORY — DX: Gastro-esophageal reflux disease without esophagitis: K21.9

## 2020-01-04 LAB — HCG, SERUM, QUALITATIVE: Preg, Serum: NEGATIVE

## 2020-01-04 LAB — SARS CORONAVIRUS 2 (TAT 6-24 HRS): SARS Coronavirus 2: NEGATIVE

## 2020-01-04 NOTE — Telephone Encounter (Signed)
PATIENT CALLED AND HAS A QUESTION ABOUT THE PREP [phone]

## 2020-01-04 NOTE — Telephone Encounter (Signed)
Called pt. FYI currently on ABX for UTI. She has pre-op today and will inform them. FYI to [facility]

## 2020-01-04 NOTE — Telephone Encounter (Signed)
Noted. Suspect this will be fine.

## 2020-01-05 ENCOUNTER — Encounter (HOSPITAL_COMMUNITY): Payer: Self-pay | Admitting: Gastroenterology

## 2020-01-05 ENCOUNTER — Other Ambulatory Visit: Payer: Self-pay

## 2020-01-05 ENCOUNTER — Encounter (HOSPITAL_COMMUNITY)
Admission: RE | Disposition: A | Discharge status: Home / Self Care | Payer: Self-pay | Source: Home / Self Care | Attending: Gastroenterology

## 2020-01-05 ENCOUNTER — Ambulatory Visit (HOSPITAL_COMMUNITY): Payer: Managed Care, Other (non HMO) | Admitting: Anesthesiology

## 2020-01-05 ENCOUNTER — Ambulatory Visit (HOSPITAL_COMMUNITY)
Admission: RE | Admit: 2020-01-05 | Discharge: 2020-01-05 | Disposition: A | Discharge status: Home / Self Care | Payer: Managed Care, Other (non HMO) | Attending: Gastroenterology | Admitting: Gastroenterology

## 2020-01-05 DIAGNOSIS — Z793 Long term (current) use of hormonal contraceptives: Secondary | ICD-10-CM | POA: Insufficient documentation

## 2020-01-05 DIAGNOSIS — Z658 Other specified problems related to psychosocial circumstances: Secondary | ICD-10-CM | POA: Diagnosis not present

## 2020-01-05 DIAGNOSIS — K625 Hemorrhage of anus and rectum: Secondary | ICD-10-CM | POA: Diagnosis present

## 2020-01-05 DIAGNOSIS — R194 Change in bowel habit: Secondary | ICD-10-CM

## 2020-01-05 DIAGNOSIS — Z87891 Personal history of nicotine dependence: Secondary | ICD-10-CM | POA: Insufficient documentation

## 2020-01-05 DIAGNOSIS — Q438 Other specified congenital malformations of intestine: Secondary | ICD-10-CM | POA: Insufficient documentation

## 2020-01-05 DIAGNOSIS — Z975 Presence of (intrauterine) contraceptive device: Secondary | ICD-10-CM | POA: Diagnosis not present

## 2020-01-05 DIAGNOSIS — K648 Other hemorrhoids: Secondary | ICD-10-CM | POA: Diagnosis not present

## 2020-01-05 DIAGNOSIS — Z7951 Long term (current) use of inhaled steroids: Secondary | ICD-10-CM | POA: Insufficient documentation

## 2020-01-05 DIAGNOSIS — J45909 Unspecified asthma, uncomplicated: Secondary | ICD-10-CM | POA: Diagnosis not present

## 2020-01-05 DIAGNOSIS — K633 Ulcer of intestine: Secondary | ICD-10-CM

## 2020-01-05 DIAGNOSIS — K529 Noninfective gastroenteritis and colitis, unspecified: Secondary | ICD-10-CM | POA: Insufficient documentation

## 2020-01-05 DIAGNOSIS — R197 Diarrhea, unspecified: Secondary | ICD-10-CM | POA: Diagnosis present

## 2020-01-05 DIAGNOSIS — Z79899 Other long term (current) drug therapy: Secondary | ICD-10-CM | POA: Diagnosis not present

## 2020-01-05 DIAGNOSIS — K219 Gastro-esophageal reflux disease without esophagitis: Secondary | ICD-10-CM | POA: Diagnosis not present

## 2020-01-05 HISTORY — PX: COLONOSCOPY WITH PROPOFOL: SHX5780

## 2020-01-05 HISTORY — PX: BIOPSY: SHX5522

## 2020-01-05 SURGERY — COLONOSCOPY WITH PROPOFOL
Anesthesia: General

## 2020-01-05 MED ORDER — HYDROMORPHONE HCL 1 MG/ML IJ SOLN: 0.2500 mg | INTRAMUSCULAR | Status: DC | PRN

## 2020-01-05 MED ORDER — PROMETHAZINE HCL 25 MG/ML IJ SOLN: 6.2500 mg | INTRAMUSCULAR | Status: DC | PRN

## 2020-01-05 MED ORDER — KETAMINE HCL 50 MG/5ML IJ SOSY
PREFILLED_SYRINGE | INTRAMUSCULAR | Status: AC
  Filled 2020-01-05: qty 5

## 2020-01-05 MED ORDER — PROPOFOL 10 MG/ML IV BOLUS
INTRAVENOUS | Status: DC | PRN
  Administered 2020-01-05: 20 mg via INTRAVENOUS

## 2020-01-05 MED ORDER — STERILE WATER FOR IRRIGATION IR SOLN: Status: DC | PRN

## 2020-01-05 MED ORDER — GLYCOPYRROLATE 0.2 MG/ML IJ SOLN
INTRAMUSCULAR | Status: DC | PRN
  Administered 2020-01-05: .2 mg via INTRAVENOUS

## 2020-01-05 MED ORDER — MIDAZOLAM HCL 2 MG/2ML IJ SOLN: 0.5000 mg | Freq: Once | INTRAMUSCULAR | Status: DC | PRN

## 2020-01-05 MED ORDER — LIDOCAINE HCL (CARDIAC) PF 100 MG/5ML IV SOSY
PREFILLED_SYRINGE | INTRAVENOUS | Status: DC | PRN
  Administered 2020-01-05: 40 mg via INTRAVENOUS

## 2020-01-05 MED ORDER — KETAMINE HCL 10 MG/ML IJ SOLN
INTRAMUSCULAR | Status: DC | PRN
  Administered 2020-01-05: 20 mg via INTRAVENOUS

## 2020-01-05 MED ORDER — CHLORHEXIDINE GLUCONATE CLOTH 2 % EX PADS: 6.0000 | MEDICATED_PAD | Freq: Once | CUTANEOUS | Status: DC

## 2020-01-05 MED ORDER — HYDROCODONE-ACETAMINOPHEN 7.5-325 MG PO TABS: 1.0000 | ORAL_TABLET | Freq: Once | ORAL | Status: DC | PRN

## 2020-01-05 MED ORDER — PROPOFOL 500 MG/50ML IV EMUL
INTRAVENOUS | Status: DC | PRN
  Administered 2020-01-05 (×2): 150 ug/kg/min via INTRAVENOUS

## 2020-01-05 MED ORDER — LACTATED RINGERS IV SOLN: INTRAVENOUS | Status: DC

## 2020-01-05 NOTE — Op Note (Signed)
[facility] Patient Name: Misty Butler Procedure Date: 01/05/2020 12:41 PM MRN: [id number] Date of Birth: [birth year] Attending MD: Barney Drain MD, MD CSN: [id number] Age: 29 Admit Type: Outpatient Procedure:                Colonoscopy WITH COLD FORCEPS BIOPSY Indications:              Change in bowel habits, Diarrhea. MULTIPLE                            PSYCHOSOCIAL STRESSORS. Providers:                Barney Drain MD, MD, Charlsie Quest. Theda Sers RN, RN,                            Rosina Lowenstein, RN Referring MD:             Chevis Pretty FNP Medicines:                Propofol per Anesthesia Complications:            No immediate complications. Estimated Blood Loss:     Estimated blood loss was minimal. Procedure:                Pre-Anesthesia Assessment:                           - Prior to the procedure, a History and Physical                            was performed, and patient medications and                            allergies were reviewed. The patient's tolerance of                            previous anesthesia was also reviewed. The risks                            and benefits of the procedure and the sedation                            options and risks were discussed with the patient.                            All questions were answered, and informed consent                            was obtained. Prior Anticoagulants: The patient has                            taken no previous anticoagulant or antiplatelet                            agents. ASA Grade Assessment: II - A patient with  mild systemic disease. After reviewing the risks                            and benefits, the patient was deemed in                            satisfactory condition to undergo the procedure.                            After obtaining informed consent, the colonoscope                            was passed under direct vision. Throughout the               procedure, the patient's blood pressure, pulse, and                            oxygen saturations were monitored continuously. The                            [id number]-[id number] ([id number]) scope was introduced through                            the anus and advanced to the 10 cm into the ileum.                            The colonoscopy was somewhat difficult due to a                            tortuous colon. Successful completion of the                            procedure was aided by straightening and shortening                            the scope to obtain bowel loop reduction and                            COLOWRAP. The patient tolerated the procedure well.                            The quality of the bowel preparation was good. The                            terminal ileum, ileocecal valve, appendiceal                            orifice, and rectum were photographed. Scope In: 1:10:13 PM Scope Out: 1:31:34 PM Scope Withdrawal Time: 0 hours 17 minutes 58 seconds  Total Procedure Duration: 0 hours 21 minutes 21 seconds  Findings:      The distal ileum contained a single (solitary) ulcer. No bleeding was       present. No stigmata of recent bleeding were seen. Biopsies were taken  with a cold forceps for histology.      The recto-sigmoid colon and sigmoid colon were mildly tortuous. Biopsies       were obtained from the proximal and distal esophagus with cold forceps       for histology of suspected eosinophilic esophagitis.      Internal hemorrhoids were found. The hemorrhoids were small.      The rectum appeared normal. Biopsies were taken with a cold forceps for       histology. Impression:               - A single (solitary) ulcer in the distal ileum.                            Biopsied.                           - Tortuous colon. Biopsied.                           - RECTAL BLEEDING DUE TO Internal hemorrhoids.                           - NO SOURCE FOR CHANGE IN BOWEL  HABIT IDENTIFIED. Moderate Sedation:      Per Anesthesia Care Recommendation:           - Patient has a contact number available for                            emergencies. The signs and symptoms of potential                            delayed complications were discussed with the                            patient. Return to normal activities tomorrow.                            Written discharge instructions were provided to the                            patient.                           - High fiber diet.                           - Continue present medications.                           - Await pathology results.                           - Repeat colonoscopy at age 60 for surveillance.                           - Return to GI clinic in 4 months. Procedure Code(s):        --- Professional ---  10932, Colonoscopy, flexible; with biopsy, single                            or multiple Diagnosis Code(s):        --- Professional ---                           K63.3, Ulcer of intestine                           K64.8, Other hemorrhoids                           R19.4, Change in bowel habit                           R19.7, Diarrhea, unspecified                           Q43.8, Other specified congenital malformations of                            intestine CPT copyright 2019 American Medical Association. All rights reserved. The codes documented in this report are preliminary and upon coder review may  be revised to meet current compliance requirements. Jonette Eva, MD Jonette Eva MD, MD 01/05/2020 1:52:26 PM This report has been signed electronically. Number of Addenda: 0

## 2020-01-05 NOTE — Transfer of Care (Signed)
Immediate Anesthesia Transfer of Care Note  Patient: Misty Butler  Procedure(s) Performed: COLONOSCOPY WITH PROPOFOL (N/A ) BIOPSY  Patient Location: PACU  Anesthesia Type:General  Level of Consciousness: awake, alert , oriented and patient cooperative  Airway & Oxygen Therapy: Patient Spontanous Breathing  Post-op Assessment: Report given to RN and Post -op Vital signs reviewed and stable  Post vital signs: Reviewed and stable  Last Vitals:  Vitals Value Taken Time  BP    Temp    Pulse 85 01/05/20 1339  Resp 12 01/05/20 1339  SpO2 100 % 01/05/20 1339  Vitals shown include unvalidated device data.  Last Pain:  Vitals:   01/05/20 1335  TempSrc:   PainSc: 0-No pain      Patients Stated Pain Goal: 4 (01/05/20 1140)  Complications: No apparent anesthesia complications

## 2020-01-05 NOTE — Anesthesia Preprocedure Evaluation (Signed)
Anesthesia Evaluation  Patient identified by MRN, date of birth, ID band Patient awake  General Assessment Comment:Pt reports this is her first anesthetic  States mom had PONV in past with GA  Reviewed: Allergy & Precautions, NPO status , Patient's Chart, lab work & pertinent test results  History of Anesthesia Complications (+) PONV and Family history of anesthesia reaction  Airway Mallampati: I  TM Distance: >3 FB Neck ROM: Full    Dental no notable dental hx. (+) Teeth Intact   Pulmonary asthma , former smoker,    Pulmonary exam normal breath sounds clear to auscultation       Cardiovascular Exercise Tolerance: Good negative cardio ROS Normal cardiovascular examI Rhythm:Regular Rate:Normal     Neuro/Psych Depression negative neurological ROS  negative psych ROS   GI/Hepatic Neg liver ROS, GERD  Medicated and Controlled,  Endo/Other  negative endocrine ROS  Renal/GU negative Renal ROS  negative genitourinary   Musculoskeletal negative musculoskeletal ROS (+)   Abdominal   Peds negative pediatric ROS (+)  Hematology negative hematology ROS (+)   Anesthesia Other Findings   Reproductive/Obstetrics negative OB ROS                             Anesthesia Physical Anesthesia Plan  ASA: II  Anesthesia Plan: General   Post-op Pain Management:    Induction: Intravenous  PONV Risk Score and Plan: 4 or greater and TIVA, Propofol infusion, Ondansetron, Midazolam and Treatment may vary due to age or medical condition  Airway Management Planned: Nasal Cannula and Simple Face Mask  Additional Equipment:   Intra-op Plan:   Post-operative Plan:   Informed Consent: I have reviewed the patients History and Physical, chart, labs and discussed the procedure including the risks, benefits and alternatives for the proposed anesthesia with the patient or authorized representative who has  indicated his/her understanding and acceptance.     Dental advisory given  Plan Discussed with: CRNA  Anesthesia Plan Comments: (Plan Full PPE use  Plan GA with GETA as needed d/w pt -WTP with same after Q&A)        Anesthesia Quick Evaluation

## 2020-01-05 NOTE — Discharge Instructions (Signed)
You have internal hemorrhoids, which is THE MOST LIKELY CAUSE OF RECTAL BLEEDING, CAN BE PAINLESS, AND ARE ASSOCIATED WITH A  NORMAL HEMOGLOBIN EVEN THOUGH BLEEDING APPEARS HEAVY. YOU DID NOT HAVE ANY POLYPS. THE LAST PART OF YOUR SMALL BOWEL HAD ONE SMALL ULCER.  I BIOPSIED YOUR SMALL BOWEL, COLON, AND RECTUM.    DRINK WATER TO KEEP YOUR URINE LIGHT YELLOW.  EAT TO LIVE AND THINK OF FOOD AS MEDICINE. 75% OF YOUR PLATE SHOULD BE FRUITS/VEGGIES. FOLLOW A HIGH FIBER DIET. AVOID ITEMS THAT CAUSE BLOATING.     To REDUCE INFLAMMATION AND RISK OF CANCER:      1. I RECOMMEND YOU READ AND FOLLOW RECOMMENDATIONS BY DR. MARK HYMAN, "10-DAY DETOX DIET".    2. If you must eat bread, EAT EZEKIEL BREAD. IT IS IN THE FROZEN SECTION OF THE GROCERY STORE.    3. DRINK WATER WITH FRUIT OR CUCUMBER ADDED. YOUR URINE SHOULD BE LIGHT YELLOW. AVOID SODA, GATORADE, ENERGY DRINKS, OR DIET SODA.     4. AVOID HIGH FRUCTOSE CORN SYRUP AND CAFFEINE.     5. DO NOT chew SUGAR FREE GUM OR USE ARTIFICIAL SWEETENERS. IF NEEDED USE STEVIA AS A SWEETENER.    6. DO NOT EAT ENRICHED WHEAT FLOUR, PASTA, RICE, OR CEREAL.    7. ONLY EAT WILD CAUGHT SEAFOOD, GRASS FED BEEF OR CHICKEN, PORK FROM PASTURE RAISE PIGS, OR EGGS FROM PASTURE RAISED CHICKENS.    8. CONSIDER CHAIR YOGA FOR 15-30 MINS 3 OR 4 TIMES A WEEK AND PROGRESS TO HATHA YOGA OVER NEXT 6 MOS.    9. START TAKING A MULTIVITAMIN, VITAMIN B12, AND VITAMIN D3 2000 IU DAILY.   USE ALPHA LIPOIC ACID TWICE DAILY OR TURMERIC ONCE DAILY.   **NATURAL ANTI-INFLAMMATORY SUPPLEMENT THAT IS AN ALTERNATIVE TO IBUPROFEN OR NAPROXEN**  USE PREPARATION H FOUR TIMES  A DAY IF NEEDED TO RELIEVE RECTAL PAIN/PRESSURE/BLEEDING.   YOUR BIOPSY RESULTS WILL BE BACK IN 5 BUSINESS DAYS.  FOLLOW UP IN 4 MOS.   Next colonoscopy in 10 years.  Colonoscopy Care After Read the instructions outlined below and refer to this sheet in the next week. These discharge instructions provide you  with general information on caring for yourself after you leave the hospital. While your treatment has been planned according to the most current medical practices available, unavoidable complications occasionally occur. If you have any problems or questions after discharge, call DR. FIELDS, [phone].  ACTIVITY  You may resume your regular activity, but move at a slower pace for the next 24 hours.   Take frequent rest periods for the next 24 hours.   Walking will help get rid of the air and reduce the bloated feeling in your belly (abdomen).   No driving for 24 hours (because of the medicine (anesthesia) used during the test).   You may shower.   Do not sign any important legal documents or operate any machinery for 24 hours (because of the anesthesia used during the test).    NUTRITION  Drink plenty of fluids.   You may resume your normal diet as instructed by your doctor.   Begin with a light meal and progress to your normal diet. Heavy or fried foods are harder to digest and may make you feel sick to your stomach (nauseated).   Avoid alcoholic beverages for 24 hours or as instructed.    MEDICATIONS  You may resume your normal medications.   WHAT YOU CAN EXPECT TODAY  Some feelings of bloating in  the abdomen.   Passage of more gas than usual.   Spotting of blood in your stool or on the toilet paper  .  IF YOU HAD POLYPS REMOVED DURING THE COLONOSCOPY:  Eat a soft diet IF YOU HAVE NAUSEA, BLOATING, ABDOMINAL PAIN, OR VOMITING.    FINDING OUT THE RESULTS OF YOUR TEST Not all test results are available during your visit. DR. Darrick Penna WILL CALL YOU WITHIN 14 DAYS OF YOUR PROCEDUE WITH YOUR RESULTS. Do not assume everything is normal if you have not heard from DR. FIELDS, CALL HER OFFICE AT [phone].  SEEK IMMEDIATE MEDICAL ATTENTION AND CALL THE OFFICE: [phone] IF:  You have more than a spotting of blood in your stool.   Your belly is swollen (abdominal  distention).   You are nauseated or vomiting.   You have a temperature over 101F.   You have abdominal pain or discomfort that is severe or gets worse throughout the day.  High-Fiber Diet A high-fiber diet changes your normal diet to include more whole grains, legumes, fruits, and vegetables. Changes in the diet involve replacing refined carbohydrates with unrefined foods. The calorie level of the diet is essentially unchanged. The Dietary Reference Intake (recommended amount) for adult males is 38 grams per day. For adult females, it is 25 grams per day. Pregnant and lactating women should consume 28 grams of fiber per day. Fiber is the intact part of a plant that is not broken down during digestion. Functional fiber is fiber that has been isolated from the plant to provide a beneficial effect in the body.  PURPOSE  Increase stool bulk.   Ease and regulate bowel movements.   Lower cholesterol.   REDUCE RISK OF COLON CANCER  INDICATIONS THAT YOU NEED MORE FIBER  Constipation and hemorrhoids.   Uncomplicated diverticulosis (intestine condition) and irritable bowel syndrome.   Weight management.   As a protective measure against hardening of the arteries (atherosclerosis), diabetes, and cancer.   GUIDELINES FOR INCREASING FIBER IN THE DIET  Start adding fiber to the diet slowly. A gradual increase of about 5 more grams (2 servings of most fruits or vegetables) per day is best. Too rapid an increase in fiber may result in constipation, flatulence, and bloating.   Drink enough water and fluids to keep your urine clear or pale yellow. Water, juice, or caffeine-free drinks are recommended. Not drinking enough fluid may cause constipation.   Eat a variety of high-fiber foods rather than one type of fiber.   Try to increase your intake of fiber through using high-fiber foods rather than fiber pills or supplements that contain small amounts of fiber.   The goal is to change the types  of food eaten. Do not supplement your present diet with high-fiber foods, but replace foods in your present diet.    Hemorrhoids Hemorrhoids are dilated (enlarged) veins around the rectum. Sometimes clots will form in the veins. This makes them swollen and painful. These are called thrombosed hemorrhoids. Causes of hemorrhoids include:  Constipation.   Straining to have a bowel movement.   HEAVY LIFTING  HOME CARE INSTRUCTIONS  Eat a well balanced diet and drink 6 to 8 glasses of water every day to avoid constipation. You may also use a bulk laxative.   Avoid straining to have bowel movements.   Keep anal area dry and clean.   Do not use a donut shaped pillow or sit on the toilet for long periods. This increases blood pooling and pain.  Move your bowels when your body has the urge; this will require less straining and will decrease pain and pressure.

## 2020-01-05 NOTE — Anesthesia Postprocedure Evaluation (Signed)
Anesthesia Post Note  Patient: Misty Butler  Procedure(s) Performed: COLONOSCOPY WITH PROPOFOL (N/A ) BIOPSY  Patient location during evaluation: PACU Anesthesia Type: General Level of consciousness: awake and awake and alert Pain management: pain level controlled Vital Signs Assessment: post-procedure vital signs reviewed and stable Respiratory status: spontaneous breathing Cardiovascular status: stable Postop Assessment: no apparent nausea or vomiting Anesthetic complications: no     Last Vitals:  Vitals:   01/05/20 1152  BP: 121/80  Pulse: 84  Resp: (!) 22  Temp: 36.7 C  SpO2: 98%    Last Pain:  Vitals:   01/05/20 1335  TempSrc:   PainSc: 0-No pain                 Governor Matos A

## 2020-01-05 NOTE — H&P (Addendum)
Primary Care Physician:  Chevis Pretty, FNP Primary Gastroenterologist:  Dr. Oneida Alar  Pre-Procedure History & Physical: HPI:  Misty Butler is a 29 y.o. female here for  BRBPR/change in bowel habits. UNDER STRESS. 2 KIDS AT HOME DUE TO COVID. SHORT HANDED AT WORK. BMs:5-6 A DAY. LAST SAW BLOOD 2 WEEKS.  Past Medical History:  Diagnosis Date  . Asthma   . Depression   . Family history of adverse reaction to anesthesia    Mom PONV  . GERD (gastroesophageal reflux disease)     History reviewed. No pertinent surgical history.  Prior to Admission medications   Medication Sig Start Date End Date Taking? Authorizing Provider  albuterol (VENTOLIN HFA) 108 (90 Base) MCG/ACT inhaler TAKE 2 PUFFS BY MOUTH EVERY 6 HOURS AS NEEDED FOR WHEEZE OR SHORTNESS OF BREATH Patient taking differently: Inhale 2 puffs into the lungs every 6 (six) hours as needed for wheezing or shortness of breath.  08/24/19  Yes Martin, Mary-Margaret, FNP  BREO ELLIPTA 100-25 MCG/INH AEPB INHALE 1 PUFF BY MOUTH EVERY DAY Patient taking differently: Inhale 1 puff into the lungs at bedtime.  10/30/19  Yes Martin, Mary-Margaret, FNP  erythromycin ophthalmic ointment Place 1 application into both eyes at bedtime. Patient taking differently: Place 1 application into both eyes daily as needed (Stye on eye).  12/14/19  Yes Martin, Mary-Margaret, FNP  fexofenadine (ALLEGRA) 180 MG tablet Take 180 mg by mouth daily.   Yes [provider]  Levonorgestrel (KYLEENA) 19.5 MG IUD 19.5 mg by Intrauterine route once.    Yes [provider]  pantoprazole (PROTONIX) 40 MG tablet Take 1 tablet (40 mg total) by mouth daily. 12/07/19  Yes Martin, Mary-Margaret, FNP  cetirizine (ZYRTEC) 10 MG tablet TAKE 1 TABLET BY MOUTH EVERY DAY Patient not taking: Reported on 12/21/2019 06/21/17   Chevis Pretty, FNP  fluticasone (FLONASE) 50 MCG/ACT nasal spray Place 2 sprays into both nostrils daily. Patient not taking:  Reported on 12/21/2019 09/19/18   Chevis Pretty, FNP  nitrofurantoin, macrocrystal-monohydrate, (MACROBID) 100 MG capsule Take 1 capsule (100 mg total) by mouth 2 (two) times daily for 7 days. 1 po BId 01/02/20 01/09/20  Baruch Gouty, FNP  VIIBRYD 20 MG TABS TAKE 1 TABLET BY MOUTH EVERY DAY Patient not taking: No sig reported 12/10/19   Chevis Pretty, FNP    Allergies as of 12/18/2019 - Review Complete 12/18/2019  Allergen Reaction Noted  . Zithromax [azithromycin]  06/02/2013    Family History  Problem Relation Age of Onset  . Migraines Mother   . Gout Father   . Autism Sister   . Colon cancer Other        passed in 81s  . Colon cancer Other        passed in 26s    Social History   Socioeconomic History  . Marital status: Married    Spouse name: Not on file  . Number of children: Not on file  . Years of education: Not on file  . Highest education level: Not on file  Occupational History  . Not on file  Tobacco Use  . Smoking status: Former Smoker    Packs/day: 0.25    Years: 10.00    Pack years: 2.50    Types: Cigarettes    Quit date: 09/04/2019    Years since quitting: 0.3  . Smokeless tobacco: Never Used  Substance and Sexual Activity  . Alcohol use: Yes    Comment: 3 drinks per week (red wine)  .  Drug use: No  . Sexual activity: Yes    Birth control/protection: I.U.D.  Other Topics Concern  . Not on file  Social History Narrative  . Not on file   Social Determinants of Health   Financial Resource Strain:   . Difficulty of Paying Living Expenses: Not on file  Food Insecurity:   . Worried About Programme researcher, broadcasting/film/video in the Last Year: Not on file  . Ran Out of Food in the Last Year: Not on file  Transportation Needs:   . Lack of Transportation (Medical): Not on file  . Lack of Transportation (Non-Medical): Not on file  Physical Activity:   . Days of Exercise per Week: Not on file  . Minutes of Exercise per Session: Not on file  Stress:   .  Feeling of Stress : Not on file  Social Connections:   . Frequency of Communication with Friends and Family: Not on file  . Frequency of Social Gatherings with Friends and Family: Not on file  . Attends Religious Services: Not on file  . Active Member of Clubs or Organizations: Not on file  . Attends Banker Meetings: Not on file  . Marital Status: Not on file  Intimate Partner Violence:   . Fear of Current or Ex-Partner: Not on file  . Emotionally Abused: Not on file  . Physically Abused: Not on file  . Sexually Abused: Not on file    Review of Systems: See HPI, otherwise negative ROS   Physical Exam: BP 121/80   Pulse 84   Temp 98.1 F (36.7 C) (Oral)   Resp (!) 22   Ht 5\' 7"  (1.702 m)   Wt 83.9 kg   LMP 12/15/2019 (Approximate)   SpO2 98%   BMI 28.98 kg/m  General:   Alert,  pleasant and cooperative in NAD Head:  Normocephalic and atraumatic. Neck:  Supple; Lungs:  Clear throughout to auscultation.    Heart:  Regular rate and rhythm. Abdomen:  Soft, nontender and nondistended. Normal bowel sounds, without guarding, and without rebound.   Neurologic:  Alert and  oriented x4;  grossly normal neurologically.  Impression/Plan:    BRBPR  PLAN: TCS TODAY. DISCUSSED PROCEDURE, BENEFITS, & RISKS: < 1% chance of medication reaction, bleeding, perforation, ASPIRATION, or rupture of spleen/liver requiring surgery to fix it and missed polyps < 1 cm 10-20% of the time.

## 2020-01-06 LAB — SURGICAL PATHOLOGY

## 2020-01-07 ENCOUNTER — Encounter: Payer: Self-pay | Admitting: Gastroenterology

## 2020-01-28 ENCOUNTER — Encounter: Payer: Self-pay | Admitting: Nurse Practitioner

## 2020-01-28 ENCOUNTER — Ambulatory Visit (INDEPENDENT_AMBULATORY_CARE_PROVIDER_SITE_OTHER): Payer: Managed Care, Other (non HMO) | Admitting: Nurse Practitioner

## 2020-01-28 ENCOUNTER — Other Ambulatory Visit: Payer: Self-pay

## 2020-01-28 DIAGNOSIS — R350 Frequency of micturition: Secondary | ICD-10-CM | POA: Diagnosis not present

## 2020-01-28 DIAGNOSIS — R358 Other polyuria: Secondary | ICD-10-CM | POA: Diagnosis not present

## 2020-01-28 DIAGNOSIS — R3589 Other polyuria: Secondary | ICD-10-CM

## 2020-01-28 NOTE — Progress Notes (Signed)
   Virtual Visit via telephone Note Due to COVID-19 pandemic this visit was conducted virtually. This visit type was conducted due to national recommendations for restrictions regarding the COVID-19 Pandemic (e.g. social distancing, sheltering in place) in an effort to limit this patient's exposure and mitigate transmission in our community. All issues noted in this document were discussed and addressed.  A physical exam was not performed with this format.  I connected with Misty Butler on 01/28/20 at 1:30 by telephone and verified that I am speaking with the correct person using two identifiers. Misty Butler is currently located at home and no one is currently with  her during visit. The provider, Mary-Margaret Hassell Done, FNP is located in their office at time of visit.  I discussed the limitations, risks, security and privacy concerns of performing an evaluation and management service by telephone and the availability of in person appointments. I also discussed with the patient that there may be a patient responsible charge related to this service. The patient expressed understanding and agreed to proceed.   History and Present Illness:  Patient said over the last few months she has been peeing 12-13 times a day. She says when she does go she pees a lot , Occasionally she will just trickle a little. Urge to void is early in the day and late in the evening. She does drink about 80-90oz a day. She is constantly sipping on water all day.   Review of Systems  Constitutional: Negative.   Respiratory: Negative.   Cardiovascular: Negative.   Genitourinary: Negative.   Skin: Negative.   Neurological: Negative.   Endo/Heme/Allergies: Positive for polydipsia.  Psychiatric/Behavioral: Negative.   All other systems reviewed and are negative.    Observations/Objective: Alert and oriented- answers all questions appropriately No distress    Assessment and Plan: Misty Butler in today  with chief complaint of Urinary Tract Infection   1. Frequency of urination and polyuria We will do labs and if blood sugar and kidney function is normal then we will try rx of myrbetriq. - CMP14+EGFR; Future    Follow Up Instructions: prn    I discussed the assessment and treatment plan with the patient. The patient was provided an opportunity to ask questions and all were answered. The patient agreed with the plan and demonstrated an understanding of the instructions.   The patient was advised to call back or seek an in-person evaluation if the symptoms worsen or if the condition fails to improve as anticipated.  The above assessment and management plan was discussed with the patient. The patient verbalized understanding of and has agreed to the management plan. Patient is aware to call the clinic if symptoms persist or worsen. Patient is aware when to return to the clinic for a follow-up visit. Patient educated on when it is appropriate to go to the emergency department.   Time call ended:  1:15  I provided 15 minutes of non-face-to-face time during this encounter.    Mary-Margaret Hassell Done, FNP

## 2020-01-30 ENCOUNTER — Other Ambulatory Visit: Payer: Self-pay | Admitting: Nurse Practitioner

## 2020-02-05 ENCOUNTER — Other Ambulatory Visit: Payer: Self-pay

## 2020-02-05 ENCOUNTER — Other Ambulatory Visit: Payer: Managed Care, Other (non HMO)

## 2020-02-05 DIAGNOSIS — R3589 Other polyuria: Secondary | ICD-10-CM

## 2020-02-05 DIAGNOSIS — R350 Frequency of micturition: Secondary | ICD-10-CM

## 2020-02-05 LAB — CMP14+EGFR
ALT: 11 IU/L (ref 0–32)
AST: 11 IU/L (ref 0–40)
Albumin/Globulin Ratio: 2 (ref 1.2–2.2)
Albumin: 4.3 g/dL (ref 3.9–5.0)
Alkaline Phosphatase: 60 IU/L (ref 39–117)
BUN/Creatinine Ratio: 23 (ref 9–23)
BUN: 16 mg/dL (ref 6–20)
Bilirubin Total: <0.2 mg/dL (ref 0.0–1.2)
CO2: 20 mmol/L (ref 20–29)
Calcium: 9.5 mg/dL (ref 8.7–10.2)
Chloride: 106 mmol/L (ref 96–106)
Creatinine, Ser: 0.69 mg/dL (ref 0.57–1.00)
GFR calc Af Amer: 135 mL/min/{1.73_m2} (ref >59)
GFR calc non Af Amer: 117 mL/min/{1.73_m2} (ref >59)
Globulin, Total: 2.1 g/dL (ref 1.5–4.5)
Glucose: 86 mg/dL (ref 65–99)
Potassium: 4.1 mmol/L (ref 3.5–5.2)
Sodium: 142 mmol/L (ref 134–144)
Total Protein: 6.4 g/dL (ref 6.0–8.5)

## 2020-03-27 ENCOUNTER — Other Ambulatory Visit: Payer: Self-pay | Admitting: Nurse Practitioner

## 2020-04-18 ENCOUNTER — Other Ambulatory Visit: Payer: Self-pay | Admitting: Nurse Practitioner

## 2020-05-19 ENCOUNTER — Ambulatory Visit: Payer: Managed Care, Other (non HMO) | Admitting: Gastroenterology

## 2020-05-31 ENCOUNTER — Encounter: Payer: Self-pay | Admitting: Nurse Practitioner

## 2020-06-01 ENCOUNTER — Telehealth: Payer: Self-pay | Admitting: Nurse Practitioner

## 2020-06-01 ENCOUNTER — Ambulatory Visit (INDEPENDENT_AMBULATORY_CARE_PROVIDER_SITE_OTHER): Payer: BC Managed Care – PPO | Admitting: Family Medicine

## 2020-06-01 ENCOUNTER — Encounter: Payer: Self-pay | Admitting: Family Medicine

## 2020-06-01 DIAGNOSIS — J40 Bronchitis, not specified as acute or chronic: Secondary | ICD-10-CM | POA: Diagnosis not present

## 2020-06-01 DIAGNOSIS — J329 Chronic sinusitis, unspecified: Secondary | ICD-10-CM

## 2020-06-01 MED ORDER — LEVOFLOXACIN 500 MG PO TABS: 500.0000 mg | ORAL_TABLET | Freq: Every day | ORAL | 0 refills | Status: DC

## 2020-06-01 MED ORDER — FLUTICASONE PROPIONATE 50 MCG/ACT NA SUSP: 2.0000 | Freq: Every day | NASAL | 6 refills | Status: DC

## 2020-06-01 NOTE — Progress Notes (Signed)
Subjective:    Patient ID: Misty Butler, female    DOB: 1991/06/29, 29 y.o.   MRN: [id number]   HPI: Misty Butler is a 29 y.o. female presenting for cough X 2.5-3 weeks. No Malaise. Mild in the morning. Dry for a few days then coughs up stuff in the mornings. Yellow to brown sputum. No fever. For 1 week having mild nose bleeds. Has asthma. Using rescue inhaler qod X 2 weeks. Usually once every week. Fully vaccinated for 6 weeks from [employer]. Chest feels heavy at times. Slight wheezing. Relief within 10-15 minutes of using the albuterol.    Depression screen [facility] 2/9 10/30/2019 09/30/2019 09/04/2019 09/19/2018 01/17/2018  Decreased Interest 0 0 0 0 0  Down, Depressed, Hopeless 0 0 0 1 0  PHQ - 2 Score 0 0 0 1 0     Relevant past medical, surgical, family and social history reviewed and updated as indicated.  Interim medical history since our last visit reviewed. Allergies and medications reviewed and updated.  ROS:  Review of Systems  Constitutional: Negative for activity change, appetite change, chills and fever.  HENT: Positive for congestion, nosebleeds (slight. Usualy blood tinged mucous), postnasal drip, rhinorrhea and sinus pressure. Negative for ear discharge, ear pain, hearing loss, sneezing and trouble swallowing.   Respiratory: Positive for cough, chest tightness and shortness of breath (see HPI).   Gastrointestinal: Negative for nausea and vomiting.  Skin: Negative for rash.     Social History   Tobacco Use  Smoking Status Former Smoker  . Packs/day: 0.25  . Years: 10.00  . Pack years: 2.50  . Types: Cigarettes  . Quit date: 09/04/2019  . Years since quitting: 0.7  Smokeless Tobacco Never Used       Objective:     Wt Readings from Last 3 Encounters:  01/05/20 185 lb (83.9 kg)  01/04/20 185 lb (83.9 kg)  12/18/19 189 lb 3.2 oz (85.8 kg)     Exam deferred. Pt. Harboring due to COVID 19. Phone visit performed.   Assessment & Plan:   1. Sinobronchitis       Meds ordered this encounter  Medications  . levofloxacin (LEVAQUIN) 500 MG tablet    Sig: Take 1 tablet (500 mg total) by mouth daily.    Dispense:  7 tablet    Refill:  0  . fluticasone (FLONASE) 50 MCG/ACT nasal spray    Sig: Place 2 sprays into both nostrils daily.    Dispense:  16 g    Refill:  6    No orders of the defined types were placed in this encounter.     Diagnoses and all orders for this visit:  Sinobronchitis  Other orders -     levofloxacin (LEVAQUIN) 500 MG tablet; Take 1 tablet (500 mg total) by mouth daily. -     fluticasone (FLONASE) 50 MCG/ACT nasal spray; Place 2 sprays into both nostrils daily.    Virtual Visit via telephone Note  I discussed the limitations, risks, security and privacy concerns of performing an evaluation and management service by telephone and the availability of in person appointments. The patient was identified with two identifiers. Pt.expressed understanding and agreed to proceed. Pt. Is at home. Dr. Darlyn Read is in his office.  Follow Up Instructions:   I discussed the assessment and treatment plan with the patient. The patient was provided an opportunity to ask questions and all were answered. The patient agreed with the plan and demonstrated an understanding of  the instructions.   The patient was advised to call back or seek an in-person evaluation if the symptoms worsen or if the condition fails to improve as anticipated.   Total minutes including chart review and phone contact time: 11   Follow up plan: Return if symptoms worsen or fail to improve.  Claretta Fraise, MD [place]

## 2020-06-02 ENCOUNTER — Other Ambulatory Visit: Payer: Self-pay | Admitting: Nurse Practitioner

## 2020-06-02 DIAGNOSIS — J4520 Mild intermittent asthma, uncomplicated: Secondary | ICD-10-CM

## 2020-08-23 ENCOUNTER — Other Ambulatory Visit: Payer: Self-pay | Admitting: Nurse Practitioner

## 2020-08-23 DIAGNOSIS — J4520 Mild intermittent asthma, uncomplicated: Secondary | ICD-10-CM

## 2020-09-26 ENCOUNTER — Other Ambulatory Visit: Payer: Self-pay | Admitting: Nurse Practitioner

## 2020-09-26 DIAGNOSIS — J4520 Mild intermittent asthma, uncomplicated: Secondary | ICD-10-CM

## 2020-10-05 ENCOUNTER — Encounter: Payer: Self-pay | Admitting: Nurse Practitioner

## 2020-10-05 ENCOUNTER — Other Ambulatory Visit: Payer: Self-pay

## 2020-10-05 ENCOUNTER — Ambulatory Visit (INDEPENDENT_AMBULATORY_CARE_PROVIDER_SITE_OTHER): Payer: BC Managed Care – PPO | Admitting: Nurse Practitioner

## 2020-10-05 VITALS — BP 110/70 | HR 72 | Temp 97.7°F | Resp 20 | Ht 67.0 in | Wt 179.0 lb

## 2020-10-05 DIAGNOSIS — Z23 Encounter for immunization: Secondary | ICD-10-CM | POA: Diagnosis not present

## 2020-10-05 DIAGNOSIS — F3342 Major depressive disorder, recurrent, in full remission: Secondary | ICD-10-CM

## 2020-10-05 DIAGNOSIS — K219 Gastro-esophageal reflux disease without esophagitis: Secondary | ICD-10-CM | POA: Diagnosis not present

## 2020-10-05 DIAGNOSIS — J4520 Mild intermittent asthma, uncomplicated: Secondary | ICD-10-CM | POA: Diagnosis not present

## 2020-10-05 DIAGNOSIS — Z0000 Encounter for general adult medical examination without abnormal findings: Secondary | ICD-10-CM | POA: Diagnosis not present

## 2020-10-05 DIAGNOSIS — Z0001 Encounter for general adult medical examination with abnormal findings: Secondary | ICD-10-CM

## 2020-10-05 DIAGNOSIS — Z1159 Encounter for screening for other viral diseases: Secondary | ICD-10-CM

## 2020-10-05 MED ORDER — BREO ELLIPTA 100-25 MCG/INH IN AEPB: INHALATION_SPRAY | RESPIRATORY_TRACT | 5 refills | Status: DC

## 2020-10-05 NOTE — Patient Instructions (Signed)
Exercising to Stay Healthy To become healthy and stay healthy, it is recommended that you do moderate-intensity and vigorous-intensity exercise. You can tell that you are exercising at a moderate intensity if your heart starts beating faster and you start breathing faster but can still hold a conversation. You can tell that you are exercising at a vigorous intensity if you are breathing much harder and faster and cannot hold a conversation while exercising. Exercising regularly is important. It has many health benefits, such as:  Improving overall fitness, flexibility, and endurance.  Increasing bone density.  Helping with weight control.  Decreasing body fat.  Increasing muscle strength.  Reducing stress and tension.  Improving overall health. How often should I exercise? Choose an activity that you enjoy, and set realistic goals. Your health care provider can help you make an activity plan that works for you. Exercise regularly as told by your health care provider. This may include:  Doing strength training two times a week, such as: ? Lifting weights. ? Using resistance bands. ? Push-ups. ? Sit-ups. ? Yoga.  Doing a certain intensity of exercise for a given amount of time. Choose from these options: ? A total of 150 minutes of moderate-intensity exercise every week. ? A total of 75 minutes of vigorous-intensity exercise every week. ? A mix of moderate-intensity and vigorous-intensity exercise every week. Children, pregnant women, people who have not exercised regularly, people who are overweight, and older adults may need to talk with a health care provider about what activities are safe to do. If you have a medical condition, be sure to talk with your health care provider before you start a new exercise program. What are some exercise ideas? Moderate-intensity exercise ideas include:  Walking 1 mile (1.6 km) in about 15  minutes.  Biking.  Hiking.  Golfing.  Dancing.  Water aerobics. Vigorous-intensity exercise ideas include:  Walking 4.5 miles (7.2 km) or more in about 1 hour.  Jogging or running 5 miles (8 km) in about 1 hour.  Biking 10 miles (16.1 km) or more in about 1 hour.  Lap swimming.  Roller-skating or in-line skating.  Cross-country skiing.  Vigorous competitive sports, such as football, basketball, and soccer.  Jumping rope.  Aerobic dancing. What are some everyday activities that can help me to get exercise?  Yard work, such as: ? Pushing a lawn mower. ? Raking and bagging leaves.  Washing your car.  Pushing a stroller.  Shoveling snow.  Gardening.  Washing windows or floors. How can I be more active in my day-to-day activities?  Use stairs instead of an elevator.  Take a walk during your lunch break.  If you drive, park your car farther away from your work or school.  If you take public transportation, get off one stop early and walk the rest of the way.  Stand up or walk around during all of your indoor phone calls.  Get up, stretch, and walk around every 30 minutes throughout the day.  Enjoy exercise with a friend. Support to continue exercising will help you keep a regular routine of activity. What guidelines can I follow while exercising?  Before you start a new exercise program, talk with your health care provider.  Do not exercise so much that you hurt yourself, feel dizzy, or get very short of breath.  Wear comfortable clothes and wear shoes with good support.  Drink plenty of water while you exercise to prevent dehydration or heat stroke.  Work out until your breathing   and your heartbeat get faster. Where to find more information  [place] Department of Health and Human Services: www.hhs.gov  Centers for Disease Control and Prevention (CDC): www.cdc.gov Summary  Exercising regularly is important. It will improve your overall fitness,  flexibility, and endurance.  Regular exercise also will improve your overall health. It can help you control your weight, reduce stress, and improve your bone density.  Do not exercise so much that you hurt yourself, feel dizzy, or get very short of breath.  Before you start a new exercise program, talk with your health care provider. This information is not intended to replace advice given to you by your health care provider. Make sure you discuss any questions you have with your health care provider. Document Revised: 11/29/2017 Document Reviewed: 11/07/2017 Elsevier Patient Education  2020 [employer]  

## 2020-10-05 NOTE — Progress Notes (Signed)
Subjective:    Patient ID: Misty Butler, female    DOB: 1991-12-06, 29 y.o.   MRN: [id number]   Chief Complaint: Annual Exam (No pap)    HPI:  1. Gastroesophageal reflux disease, unspecified whether esophagitis present Doing better. Takes protonix as needed now instead of daily.  2. Recurrent major depressive disorder, in full remission (HCC) Is not on any medication right now. Reports no symptoms of depression at this time. Does admit to some anxiety from recent purchase of home and moving but that is better now.   Office Visit from 10/05/2020 in [facility]  PHQ-2 Total Score 0      3. Mild intermittent asthma without complication Has had to use albuterol inhaler 1-2x/week since moving but believes this is due to stirring up dust while getting things better.    Outpatient Encounter Medications as of 10/05/2020  Medication Sig   albuterol (VENTOLIN HFA) 108 (90 Base) MCG/ACT inhaler INHALE 2 PUFFS BY MOUTH INTO THE LUNGS EVERY 6 HOURS AS NEEDED FOR WHEEZE OR SHORTNESS OF BREATH   BREO ELLIPTA 100-25 MCG/INH AEPB USE 1 INHALATION DAILY   erythromycin ophthalmic ointment Place 1 application into both eyes at bedtime. (Patient taking differently: Place 1 application into both eyes daily as needed (Stye on eye). )   fexofenadine (ALLEGRA) 180 MG tablet Take 180 mg by mouth daily.   fluticasone (FLONASE) 50 MCG/ACT nasal spray Place 2 sprays into both nostrils daily.   levofloxacin (LEVAQUIN) 500 MG tablet Take 1 tablet (500 mg total) by mouth daily.   Levonorgestrel (KYLEENA) 19.5 MG IUD 19.5 mg by Intrauterine route once.    pantoprazole (PROTONIX) 40 MG tablet TAKE 1 TABLET BY MOUTH EVERY DAY   No facility-administered encounter medications on file as of 10/05/2020.    Past Surgical History:  Procedure Laterality Date   BIOPSY  01/05/2020   Procedure: BIOPSY;  Surgeon: West Bali, MD;  [facility];  Service: Endoscopy;;    COLONOSCOPY WITH PROPOFOL N/A 01/05/2020   Procedure: COLONOSCOPY WITH PROPOFOL;  Surgeon: West Bali, MD;  [facility];  Service: Endoscopy;  Laterality: N/A;  2:00    Family History  Problem Relation Age of Onset   Migraines Mother    Gout Father    Autism Sister    Colon cancer Other        passed in 29s   Colon cancer Other        passed in 72s    New complaints: Upper back pain that comes and goes. It has been worse since she moved. Takes advil for pain which does help.  Social history: Moved recently  Controlled substance contract: N/A    Review of Systems  Constitutional: Negative.   HENT: Negative.   Eyes: Negative.   Respiratory: Positive for chest tightness, shortness of breath and wheezing.        Symptoms relieved with albuterol inhaler  Cardiovascular: Negative.   Gastrointestinal: Negative.   Genitourinary: Negative.   Musculoskeletal: Positive for back pain (occasionally).  Skin: Negative.   Neurological: Negative.   Psychiatric/Behavioral: Negative.        Objective:   Physical Exam Vitals and nursing note reviewed.  Constitutional:      Appearance: Normal appearance.  HENT:     Head: Normocephalic.     Right Ear: Tympanic membrane normal.     Left Ear: Tympanic membrane normal.     Nose: Nose normal.  Mouth/Throat:     Mouth: Mucous membranes are moist.     Pharynx: Oropharynx is clear.  Eyes:     Pupils: Pupils are equal, round, and reactive to light.  Cardiovascular:     Rate and Rhythm: Normal rate and regular rhythm.     Pulses: Normal pulses.     Heart sounds: Normal heart sounds.  Pulmonary:     Effort: Pulmonary effort is normal.     Breath sounds: Normal breath sounds.  Abdominal:     General: Bowel sounds are normal.     Palpations: Abdomen is soft.  Musculoskeletal:        General: Normal range of motion.     Cervical back: Normal range of motion.  Skin:    General: Skin is warm and dry.      Capillary Refill: Capillary refill takes less than 2 seconds.  Neurological:     General: No focal deficit present.     Mental Status: She is alert and oriented to person, place, and time.  Psychiatric:        Mood and Affect: Mood normal.        Behavior: Behavior normal.   BP 110/70    Pulse 72    Temp 97.7 F (36.5 C) (Temporal)    Resp 20    Ht 5\' 7"  (1.702 m)    Wt 179 lb (81.2 kg)    BMI 28.04 kg/m      Assessment & Plan:  WENONA MAYVILLE comes in today with chief complaint of Annual Exam (No pap)   Diagnosis and orders addressed:  1. Gastroesophageal reflux disease, unspecified whether esophagitis present Avoid spicy foods Do not eat 2 hours prior to bedtime Take protonix PRN  2. Recurrent major depressive disorder, in full remission [facility]) Stress management  3. Mild intermittent asthma without complication Use albuterol inhaler as needed Avoid triggers (dust, pet dander, etc) - fluticasone furoate-vilanterol (BREO ELLIPTA) 100-25 MCG/INH AEPB; USE 1 INHALATION DAILY  Dispense: 60 each; Refill: 5  Labs pending Health Maintenance reviewed Diet and exercise encouraged  Follow up plan: Prn    Mary-Margaret [facility], FNP

## 2020-10-06 LAB — CMP14+EGFR
ALT: 19 IU/L (ref 0–32)
AST: 21 IU/L (ref 0–40)
Albumin/Globulin Ratio: 2 (ref 1.2–2.2)
Albumin: 4.8 g/dL (ref 3.8–4.8)
Alkaline Phosphatase: 74 IU/L (ref 44–121)
BUN/Creatinine Ratio: 13 (ref 9–23)
BUN: 9 mg/dL (ref 6–20)
Bilirubin Total: 0.3 mg/dL (ref 0.0–1.2)
CO2: 23 mmol/L (ref 20–29)
Calcium: 9.2 mg/dL (ref 8.7–10.2)
Chloride: 103 mmol/L (ref 96–106)
Creatinine, Ser: 0.68 mg/dL (ref 0.57–1.00)
GFR calc Af Amer: 135 mL/min/{1.73_m2} (ref >59)
GFR calc non Af Amer: 117 mL/min/{1.73_m2} (ref >59)
Globulin, Total: 2.4 g/dL (ref 1.5–4.5)
Glucose: 84 mg/dL (ref 65–99)
Potassium: 4.2 mmol/L (ref 3.5–5.2)
Sodium: 139 mmol/L (ref 134–144)
Total Protein: 7.2 g/dL (ref 6.0–8.5)

## 2020-10-06 LAB — CBC WITH DIFFERENTIAL/PLATELET
Basophils Absolute: 0 10*3/uL (ref 0.0–0.2)
Basos: 0 %
EOS (ABSOLUTE): 0.2 10*3/uL (ref 0.0–0.4)
Eos: 2 %
Hematocrit: 42 % (ref 34.0–46.6)
Hemoglobin: 14.3 g/dL (ref 11.1–15.9)
Immature Grans (Abs): 0 10*3/uL (ref 0.0–0.1)
Immature Granulocytes: 0 %
Lymphocytes Absolute: 1.9 10*3/uL (ref 0.7–3.1)
Lymphs: 27 %
MCH: 31 pg (ref 26.6–33.0)
MCHC: 34 g/dL (ref 31.5–35.7)
MCV: 91 fL (ref 79–97)
Monocytes Absolute: 0.4 10*3/uL (ref 0.1–0.9)
Monocytes: 6 %
Neutrophils Absolute: 4.6 10*3/uL (ref 1.4–7.0)
Neutrophils: 65 %
Platelets: 156 10*3/uL (ref 150–450)
RBC: 4.61 x10E6/uL (ref 3.77–5.28)
RDW: 11.9 % (ref 11.7–15.4)
WBC: 7.2 10*3/uL (ref 3.4–10.8)

## 2020-10-06 LAB — LIPID PANEL
Chol/HDL Ratio: 3.5 ratio (ref 0.0–4.4)
Cholesterol, Total: 193 mg/dL (ref 100–199)
HDL: 55 mg/dL (ref >39)
LDL Chol Calc (NIH): 128 mg/dL — ABNORMAL HIGH (ref 0–99)
Triglycerides: 55 mg/dL (ref 0–149)
VLDL Cholesterol Cal: 10 mg/dL (ref 5–40)

## 2020-10-06 LAB — THYROID PANEL WITH TSH
Free Thyroxine Index: 2.5 (ref 1.2–4.9)
T3 Uptake Ratio: 26 % (ref 24–39)
T4, Total: 9.5 ug/dL (ref 4.5–12.0)
TSH: 0.975 u[IU]/mL (ref 0.450–4.500)

## 2020-10-06 LAB — HEPATITIS C ANTIBODY: Hep C Virus Ab: <0.1 s/co ratio (ref 0.0–0.9)

## 2020-11-15 ENCOUNTER — Ambulatory Visit (INDEPENDENT_AMBULATORY_CARE_PROVIDER_SITE_OTHER): Payer: BC Managed Care – PPO | Admitting: Nurse Practitioner

## 2020-11-15 ENCOUNTER — Encounter: Payer: Self-pay | Admitting: Nurse Practitioner

## 2020-11-15 DIAGNOSIS — N760 Acute vaginitis: Secondary | ICD-10-CM | POA: Diagnosis not present

## 2020-11-15 MED ORDER — FLUCONAZOLE 150 MG PO TABS: 150.0000 mg | ORAL_TABLET | Freq: Once | ORAL | 0 refills | Status: AC

## 2020-11-15 NOTE — Progress Notes (Signed)
   Virtual Visit via telephone Note Due to COVID-19 pandemic this visit was conducted virtually. This visit type was conducted due to national recommendations for restrictions regarding the COVID-19 Pandemic (e.g. social distancing, sheltering in place) in an effort to limit this patient's exposure and mitigate transmission in our community. All issues noted in this document were discussed and addressed.  A physical exam was not performed with this format.  I connected with Misty Butler on 11/15/20 at 9:40 by telephone and verified that I am speaking with the correct person using two identifiers. Misty Butler is currently located at home and no one is currently with  her during visit. The provider, Mary-Margaret Daphine Deutscher, FNP is located in their office at time of visit.  I discussed the limitations, risks, security and privacy concerns of performing an evaluation and management service by telephone and the availability of in person appointments. I also discussed with the patient that there may be a patient responsible charge related to this service. The patient expressed understanding and agreed to proceed.   History and Present Illness:   Chief Complaint: Vaginitis   HPI Patient calls in c/o discharge with itching after voiding. She has a slight discharge. Has no odor. Denies and urinary frequency or urgency. Started several days ago.  Review of Systems  Genitourinary: Negative for dysuria, flank pain, frequency, hematuria and urgency.  All other systems reviewed and are negative.    Observations/Objective: Alert and oriented- answers all questions appropriately No distress    Assessment and Plan: CLOVIA REINE in today with chief complaint of Vaginitis   1. Acute vaginitis Avoid bubble baths Void after intercourse Meds ordered this encounter  Medications  . fluconazole (DIFLUCAN) 150 MG tablet    Sig: Take 1 tablet (150 mg total) by mouth once for 1 dose.     Dispense:  1 tablet    Refill:  0    Order Specific Question:   Supervising Provider    Answer:   Arville Care A [[id number]]        Follow Up Instructions: prn    I discussed the assessment and treatment plan with the patient. The patient was provided an opportunity to ask questions and all were answered. The patient agreed with the plan and demonstrated an understanding of the instructions.   The patient was advised to call back or seek an in-person evaluation if the symptoms worsen or if the condition fails to improve as anticipated.  The above assessment and management plan was discussed with the patient. The patient verbalized understanding of and has agreed to the management plan. Patient is aware to call the clinic if symptoms persist or worsen. Patient is aware when to return to the clinic for a follow-up visit. Patient educated on when it is appropriate to go to the emergency department.   Time call ended:  9:51 I provided 11 minutes of non-face-to-face time during this encounter.    Mary-Margaret Daphine Deutscher, FNP

## 2020-11-18 ENCOUNTER — Other Ambulatory Visit: Payer: Self-pay

## 2020-11-18 ENCOUNTER — Other Ambulatory Visit: Payer: BC Managed Care – PPO

## 2020-11-18 DIAGNOSIS — R300 Dysuria: Secondary | ICD-10-CM | POA: Diagnosis not present

## 2020-11-18 LAB — URINALYSIS, COMPLETE
Bilirubin, UA: NEGATIVE
Glucose, UA: NEGATIVE
Ketones, UA: NEGATIVE
Leukocytes,UA: NEGATIVE
Nitrite, UA: NEGATIVE
Protein,UA: NEGATIVE
RBC, UA: NEGATIVE
Specific Gravity, UA: 1.02 (ref 1.005–1.030)
Urobilinogen, Ur: 0.2 mg/dL (ref 0.2–1.0)
pH, UA: 7 (ref 5.0–7.5)

## 2020-11-18 LAB — MICROSCOPIC EXAMINATION: RBC, Urine: NONE SEEN /hpf (ref 0–2)

## 2020-11-23 LAB — URINE CULTURE

## 2020-12-16 ENCOUNTER — Other Ambulatory Visit: Payer: Self-pay | Admitting: Nurse Practitioner

## 2020-12-16 MED ORDER — AMOXICILLIN 875 MG PO TABS: 875.0000 mg | ORAL_TABLET | Freq: Two times a day (BID) | ORAL | 0 refills | Status: DC

## 2020-12-26 DIAGNOSIS — Z20822 Contact with and (suspected) exposure to covid-19: Secondary | ICD-10-CM | POA: Diagnosis not present

## 2021-03-21 ENCOUNTER — Other Ambulatory Visit: Payer: Self-pay | Admitting: Nurse Practitioner

## 2021-05-02 ENCOUNTER — Telehealth: Payer: Self-pay

## 2021-05-02 NOTE — Telephone Encounter (Signed)
Patient had a positive at home Covid test today and would like to schedule an appointment with Gennette Pac for a phone visit.  We do not have any available appointments with her until Thursday, so an appointment was offered an appointment this afternoon with Je.  Patient declines appointment and said she would rather wait for Gennette Pac on Thursday.  Appointment scheduled 05/04/21 at 8:30 am for a telephone visit.

## 2021-05-04 ENCOUNTER — Encounter: Payer: Self-pay | Admitting: Nurse Practitioner

## 2021-05-04 ENCOUNTER — Ambulatory Visit (INDEPENDENT_AMBULATORY_CARE_PROVIDER_SITE_OTHER): Payer: Managed Care, Other (non HMO) | Admitting: Nurse Practitioner

## 2021-05-04 DIAGNOSIS — U071 COVID-19: Secondary | ICD-10-CM

## 2021-05-04 MED ORDER — DOXYCYCLINE HYCLATE 100 MG PO TABS: 100.0000 mg | ORAL_TABLET | Freq: Two times a day (BID) | ORAL | 0 refills | Status: DC

## 2021-05-04 MED ORDER — BENZONATATE 100 MG PO CAPS: 100.0000 mg | ORAL_CAPSULE | Freq: Three times a day (TID) | ORAL | 0 refills | Status: DC | PRN

## 2021-05-04 NOTE — Progress Notes (Signed)
Virtual Visit  Note Due to COVID-19 pandemic this visit was conducted virtually. This visit type was conducted due to national recommendations for restrictions regarding the COVID-19 Pandemic (e.g. social distancing, sheltering in place) in an effort to limit this patient's exposure and mitigate transmission in our community. All issues noted in this document were discussed and addressed.  A physical exam was not performed with this format.  I connected with Misty Butler on 05/04/21 at 8:50 by telephone and verified that I am speaking with the correct person using two identifiers. Misty Butler is currently located at home and no one is currently with her during visit. The provider, Mary-Margaret Daphine Deutscher, FNP is located in their office at time of visit.  I discussed the limitations, risks, security and privacy concerns of performing an evaluation and management service by telephone and the availability of in person appointments. I also discussed with the patient that there may be a patient responsible charge related to this service. The patient expressed understanding and agreed to proceed.   History and Present Illness:   Chief Complaint: Covid Positive   HPI Patient calls in testing positive for covid on Tuesday morning. She is c/o cough, congestion, low grade fever and upper back pain. She says she is sweaty. Cough went form nonprductive to productive today. She has been taking vitamin C and zinc.     Review of Systems  Constitutional: Positive for fever. Negative for chills.  HENT: Positive for congestion. Negative for sore throat.   Respiratory: Positive for cough and sputum production. Negative for shortness of breath.   Musculoskeletal: Positive for back pain.  Neurological: Negative for headaches.  All other systems reviewed and are negative.    Observations/Objective: Alert and oriented- answers all questions appropriately No distress Voice hoarse Deep wet  cough   Assessment and Plan: Misty Butler in today with chief complaint of Covid Positive   1. Lab test positive for detection of COVID-19 virus 1. Take meds as prescribed 2. Use a cool mist humidifier especially during the winter months and when heat has been humid. 3. Use saline nose sprays frequently 4. Saline irrigations of the nose can be very helpful if done frequently.  * 4X daily for 1 week*  * Use of a nettie pot can be helpful with this. Follow directions with this* 5. Drink plenty of fluids 6. Keep thermostat turn down low 7.For any cough or congestion 8. For fever or aces or pains- take tylenol or ibuprofen appropriate for age and weight.  * for fevers greater than 101 orally you may alternate ibuprofen and tylenol every  3 hours.    - doxycycline (VIBRA-TABS) 100 MG tablet; Take 1 tablet (100 mg total) by mouth 2 (two) times daily. 1 po bid  Dispense: 14 tablet; Refill: 0 - benzonatate (TESSALON PERLES) 100 MG capsule; Take 1 capsule (100 mg total) by mouth 3 (three) times daily as needed for cough.  Dispense: 20 capsule; Refill: 0       Follow Up Instructions: prn    I discussed the assessment and treatment plan with the patient. The patient was provided an opportunity to ask questions and all were answered. The patient agreed with the plan and demonstrated an understanding of the instructions.   The patient was advised to call back or seek an in-person evaluation if the symptoms worsen or if the condition fails to improve as anticipated.  The above assessment and management plan was discussed with the patient.  The patient verbalized understanding of and has agreed to the management plan. Patient is aware to call the clinic if symptoms persist or worsen. Patient is aware when to return to the clinic for a follow-up visit. Patient educated on when it is appropriate to go to the emergency department.   Time call ended:  9:03  I provided 13 minutes of  non  face-to-face time during this encounter.    Mary-Margaret Daphine Deutscher, FNP

## 2021-07-05 ENCOUNTER — Ambulatory Visit: Payer: Managed Care, Other (non HMO) | Admitting: Nurse Practitioner

## 2021-07-05 ENCOUNTER — Ambulatory Visit (INDEPENDENT_AMBULATORY_CARE_PROVIDER_SITE_OTHER): Payer: Managed Care, Other (non HMO)

## 2021-07-05 ENCOUNTER — Other Ambulatory Visit: Payer: Self-pay

## 2021-07-05 ENCOUNTER — Encounter: Payer: Self-pay | Admitting: Nurse Practitioner

## 2021-07-05 VITALS — BP 125/80 | HR 88 | Temp 98.1°F | Ht 67.0 in | Wt 180.0 lb

## 2021-07-05 DIAGNOSIS — R042 Hemoptysis: Secondary | ICD-10-CM | POA: Insufficient documentation

## 2021-07-05 NOTE — Patient Instructions (Signed)
Hemoptysis Hemoptysis is when you cough up blood. If it is mild, you may cough up bloody spit and mucus from your lungs. If it is very bad, you cough up a lot of blood. If you cough up 1-2 cups (240-480 mL) of blood within 24 hours get help right away. Common causes of mild (nonmassive) hemoptysis include: Infection in your nose, throat, or lungs. Nosebleeds. Breathing in an unknown object. Common causes of very bad (massive) hemoptysis include: Tuberculosis (TB). A tumor in the lungs or other part of your airway. A blood clot in the lungs. Stomach bleeding or ulcer. Taking blood thinners. Having problems with blood clotting. Sometimes, the cause is not known. Follow these instructions at home: Medicines If you were prescribed an antibiotic medicine, take it as told by your doctor. Do not stop taking the antibiotic even if you start to feel better. Take over-the-counter and prescription medicines only as told by your doctor. General instructions  Do not use any products that contain nicotine or tobacco, such as cigarettes, e-cigarettes, and chewing tobacco. If you need help quitting, ask your doctor. Return to your normal activities as told by your doctor. Ask your doctor what activities are safe for you. Ask your doctor if it is okay for you to exercise or go on an airplane. Keep all follow-up visits as told by your doctor. This is important.  Contact a doctor if: You have a fever over 100.69F (38C). You cough up more blood than before. The blood looks brighter or darker red. Get help right away if: You cough up fresh blood or blood clots. You cough up 1-2 cups (240-480 mL) in 24 hours. You have trouble breathing. You feel like you are choking. You have chest pain. You feel dizzy or light-headed. Summary Hemoptysis is coughing up blood. If you cough up 1-2 cups (240-480 mL) of blood in 24 hours get help right away. Do not use any products that contain nicotine or tobacco,  such as cigarettes, e-cigarettes, and chewing tobacco. If you need help quitting, ask your doctor. This information is not intended to replace advice given to you by your health care provider. Make sure you discuss any questions you have with your healthcare provider. Document Revised: 12/28/2019 Document Reviewed: 12/28/2019 Elsevier Patient Education  2022 [employer].

## 2021-07-05 NOTE — Assessment & Plan Note (Addendum)
One-time occurrence of blood tinged sputum after coughing.  Patient has a history of asthma in in the past has had nosebleed.  Completed chest x-ray, CBC and CMP.  Advised patient to watch for second or third occurrence.  Follow-up with worsening or unresolved symptoms.  Will treat appropriately pending results.  Provided education to patient printed handouts given.

## 2021-07-05 NOTE — Progress Notes (Signed)
Acute Office Visit  Subjective:    Patient ID: Misty Butler, female    DOB: 08/12/1991, 30 y.o.   MRN: [id number]  Chief Complaint  Patient presents with  . blood in sputum    HPI Patient is a 30 year old female who presents to clinic for hemoptysis.  Patient has a history of asthma and nosebleeds.  Patient reports this is only one occurrence and was concerned for the bright red blood which she believes was coming from her cough. This is not new but intermittently happens after few months.  Patient would like to check and make sure it is not anything worse.   Past Medical History:  Diagnosis Date  . Asthma   . Depression   . Family history of adverse reaction to anesthesia    Mom PONV  . GERD (gastroesophageal reflux disease)     Past Surgical History:  Procedure Laterality Date  . BIOPSY  01/05/2020   Procedure: BIOPSY;  Surgeon: West Bali, MD;  [facility];  Service: Endoscopy;;  . COLONOSCOPY WITH PROPOFOL N/A 01/05/2020   Procedure: COLONOSCOPY WITH PROPOFOL;  Surgeon: West Bali, MD;  [facility];  Service: Endoscopy;  Laterality: N/A;  2:00    Family History  Problem Relation Age of Onset  . Migraines Mother   . Gout Father   . Autism Sister   . Colon cancer Other        passed in 40s  . Colon cancer Other        passed in 68s    Social History   Socioeconomic History  . Marital status: Married    Spouse name: Not on file  . Number of children: Not on file  . Years of education: Not on file  . Highest education level: Not on file  Occupational History  . Not on file  Tobacco Use  . Smoking status: Former    Packs/day: 0.25    Years: 10.00    Pack years: 2.50    Types: Cigarettes    Quit date: 09/04/2019    Years since quitting: 1.8  . Smokeless tobacco: Never  Vaping Use  . Vaping Use: Never used  Substance and Sexual Activity  . Alcohol use: Yes    Comment: 3 drinks per week (red wine)  . Drug use: No  .  Sexual activity: Yes    Birth control/protection: I.U.D.  Other Topics Concern  . Not on file  Social History Narrative  . Not on file   Social Determinants of Health   Financial Resource Strain: Not on file  Food Insecurity: Not on file  Transportation Needs: Not on file  Physical Activity: Not on file  Stress: Not on file  Social Connections: Not on file  Intimate Partner Violence: Not on file    Outpatient Medications Prior to Visit  Medication Sig Dispense Refill  . albuterol (VENTOLIN HFA) 108 (90 Base) MCG/ACT inhaler INHALE 2 PUFFS BY MOUTH INTO THE LUNGS EVERY 6 HOURS AS NEEDED FOR WHEEZE OR SHORTNESS OF BREATH 18 g 2  . fexofenadine (ALLEGRA) 180 MG tablet Take 180 mg by mouth daily.    . fluticasone furoate-vilanterol (BREO ELLIPTA) 100-25 MCG/INH AEPB USE 1 INHALATION DAILY 60 each 5  . levonorgestrel (KYLEENA) 19.5 MG IUD 19.5 mg by Intrauterine route once.     . pantoprazole (PROTONIX) 40 MG tablet TAKE 1 TABLET BY MOUTH EVERY DAY 90 tablet 1  . amoxicillin (AMOXIL) 875 MG tablet Take  1 tablet (875 mg total) by mouth 2 (two) times daily. 1 po BID 20 tablet 0  . benzonatate (TESSALON PERLES) 100 MG capsule Take 1 capsule (100 mg total) by mouth 3 (three) times daily as needed for cough. 20 capsule 0  . doxycycline (VIBRA-TABS) 100 MG tablet Take 1 tablet (100 mg total) by mouth 2 (two) times daily. 1 po bid 14 tablet 0  . erythromycin ophthalmic ointment Place 1 application into both eyes at bedtime. (Patient taking differently: Place 1 application into both eyes daily as needed (Stye on eye). ) 3.5 g 0  . fluticasone (FLONASE) 50 MCG/ACT nasal spray Place 2 sprays into both nostrils daily. 16 g 6   No facility-administered medications prior to visit.    Allergies  Allergen Reactions  . Zithromax [Azithromycin]     Lightheaded and black out    Review of Systems  Constitutional: Negative.   HENT: Negative.    Respiratory: Negative.    Gastrointestinal: Negative.    Genitourinary: Negative.   Musculoskeletal: Negative.   Skin: Negative.   All other systems reviewed and are negative.     Objective:    Physical Exam Vitals and nursing note reviewed.  Constitutional:      Appearance: Normal appearance.  HENT:     Head: Normocephalic.     Right Ear: Ear canal and external ear normal.     Nose: Nose normal. No congestion.     Mouth/Throat:     Mouth: Mucous membranes are moist.     Pharynx: Oropharynx is clear. No oropharyngeal exudate.  Eyes:     Conjunctiva/sclera: Conjunctivae normal.  Cardiovascular:     Rate and Rhythm: Normal rate and regular rhythm.     Pulses: Normal pulses.     Heart sounds: Normal heart sounds.  Pulmonary:     Effort: Pulmonary effort is normal.     Breath sounds: Normal breath sounds.  Abdominal:     General: Bowel sounds are normal.  Musculoskeletal:        General: Normal range of motion.  Skin:    Findings: No rash.  Neurological:     Mental Status: She is alert and oriented to person, place, and time.  Psychiatric:        Behavior: Behavior normal.    BP 125/80   Pulse 88   Temp 98.1 F (36.7 C) (Temporal)   Ht 5\' 7"  (1.702 m)   Wt 180 lb (81.6 kg)   SpO2 99%   BMI 28.19 kg/m  Wt Readings from Last 3 Encounters:  07/05/21 180 lb (81.6 kg)  10/05/20 179 lb (81.2 kg)  01/05/20 185 lb (83.9 kg)    Health Maintenance Due  Topic Date Due  . PAP SMEAR-Modifier  02/26/2019  . COVID-19 Vaccine (3 - Booster for Pfizer series) 09/07/2020    There are no preventive care reminders to display for this patient.   Lab Results  Component Value Date   TSH 0.975 10/05/2020   Lab Results  Component Value Date   WBC 7.2 10/05/2020   HGB 14.3 10/05/2020   HCT 42.0 10/05/2020   MCV 91 10/05/2020   PLT 156 10/05/2020   Lab Results  Component Value Date   NA 139 10/05/2020   K 4.2 10/05/2020   CO2 23 10/05/2020   GLUCOSE 84 10/05/2020   BUN 9 10/05/2020   CREATININE 0.68 10/05/2020    BILITOT 0.3 10/05/2020   ALKPHOS 74 10/05/2020   AST 21 10/05/2020  ALT 19 10/05/2020   PROT 7.2 10/05/2020   ALBUMIN 4.8 10/05/2020   CALCIUM 9.2 10/05/2020   Lab Results  Component Value Date   CHOL 193 10/05/2020   Lab Results  Component Value Date   HDL 55 10/05/2020   Lab Results  Component Value Date   LDLCALC 128 (H) 10/05/2020   Lab Results  Component Value Date   TRIG 55 10/05/2020   Lab Results  Component Value Date   CHOLHDL 3.5 10/05/2020   No results found for: HGBA1C     Assessment & Plan:   Problem List Items Addressed This Visit       Other   Hemoptysis - Primary    One-time occurrence of blood tinged sputum after coughing.  Patient has a history of asthma in in the past has had nosebleed.  Completed chest x-ray, CBC and CMP.  Advised patient to watch for second or third occurrence.  Follow-up with worsening or unresolved symptoms.  Will treat appropriately pending results.  Provided education to patient printed handouts given.       Relevant Orders   CBC with Differential   Comprehensive metabolic panel   DG Chest 2 View     No orders of the defined types were placed in this encounter.    Daryll Drown, NP

## 2021-07-06 LAB — COMPREHENSIVE METABOLIC PANEL
ALT: 13 IU/L (ref 0–32)
AST: 13 IU/L (ref 0–40)
Albumin/Globulin Ratio: 2 (ref 1.2–2.2)
Albumin: 4.8 g/dL (ref 3.8–4.8)
Alkaline Phosphatase: 61 IU/L (ref 44–121)
BUN/Creatinine Ratio: 11 (ref 9–23)
BUN: 8 mg/dL (ref 6–20)
Bilirubin Total: 0.3 mg/dL (ref 0.0–1.2)
CO2: 26 mmol/L (ref 20–29)
Calcium: 10 mg/dL (ref 8.7–10.2)
Chloride: 101 mmol/L (ref 96–106)
Creatinine, Ser: 0.72 mg/dL (ref 0.57–1.00)
Globulin, Total: 2.4 g/dL (ref 1.5–4.5)
Glucose: 90 mg/dL (ref 65–99)
Potassium: 4.2 mmol/L (ref 3.5–5.2)
Sodium: 142 mmol/L (ref 134–144)
Total Protein: 7.2 g/dL (ref 6.0–8.5)
eGFR: 114 mL/min/{1.73_m2} (ref >59)

## 2021-07-06 LAB — CBC WITH DIFFERENTIAL/PLATELET
Basophils Absolute: 0 10*3/uL (ref 0.0–0.2)
Basos: 1 %
EOS (ABSOLUTE): 0.2 10*3/uL (ref 0.0–0.4)
Eos: 3 %
Hematocrit: 41.7 % (ref 34.0–46.6)
Hemoglobin: 14.3 g/dL (ref 11.1–15.9)
Immature Grans (Abs): 0 10*3/uL (ref 0.0–0.1)
Immature Granulocytes: 0 %
Lymphocytes Absolute: 1.9 10*3/uL (ref 0.7–3.1)
Lymphs: 29 %
MCH: 31.1 pg (ref 26.6–33.0)
MCHC: 34.3 g/dL (ref 31.5–35.7)
MCV: 91 fL (ref 79–97)
Monocytes Absolute: 0.3 10*3/uL (ref 0.1–0.9)
Monocytes: 5 %
Neutrophils Absolute: 4 10*3/uL (ref 1.4–7.0)
Neutrophils: 62 %
Platelets: 179 10*3/uL (ref 150–450)
RBC: 4.6 x10E6/uL (ref 3.77–5.28)
RDW: 11.9 % (ref 11.7–15.4)
WBC: 6.4 10*3/uL (ref 3.4–10.8)

## 2021-07-21 ENCOUNTER — Ambulatory Visit: Payer: Managed Care, Other (non HMO) | Admitting: Nurse Practitioner

## 2021-08-28 ENCOUNTER — Other Ambulatory Visit: Payer: Self-pay | Admitting: Nurse Practitioner

## 2021-08-28 DIAGNOSIS — J4520 Mild intermittent asthma, uncomplicated: Secondary | ICD-10-CM

## 2021-09-06 ENCOUNTER — Telehealth: Payer: Self-pay | Admitting: Nurse Practitioner

## 2021-09-06 MED ORDER — ERYTHROMYCIN 5 MG/GM OP OINT: 1.0000 "application " | TOPICAL_OINTMENT | Freq: Every day | OPHTHALMIC | 0 refills | Status: DC

## 2021-09-06 NOTE — Telephone Encounter (Signed)
Erythromycin ophthalmic ointment

## 2021-10-10 ENCOUNTER — Telehealth: Payer: Self-pay | Admitting: Nurse Practitioner

## 2021-10-10 ENCOUNTER — Other Ambulatory Visit: Payer: Self-pay | Admitting: Nurse Practitioner

## 2021-10-10 DIAGNOSIS — J4520 Mild intermittent asthma, uncomplicated: Secondary | ICD-10-CM

## 2021-10-10 NOTE — Telephone Encounter (Signed)
MMM NTBS 30 days given 08/28/21

## 2021-10-11 MED ORDER — ALBUTEROL SULFATE HFA 108 (90 BASE) MCG/ACT IN AERS: INHALATION_SPRAY | RESPIRATORY_TRACT | 0 refills | Status: DC

## 2021-10-11 MED ORDER — FLUTICASONE FUROATE-VILANTEROL 100-25 MCG/INH IN AEPB
INHALATION_SPRAY | RESPIRATORY_TRACT | 0 refills | Status: DC
Start: 2021-10-11 — End: 2021-10-23

## 2021-10-11 NOTE — Telephone Encounter (Signed)
Patient aware and verbalized understanding. Refill sent to pharmacy until appt

## 2021-10-23 ENCOUNTER — Other Ambulatory Visit: Payer: Self-pay | Admitting: Nurse Practitioner

## 2021-10-23 ENCOUNTER — Ambulatory Visit: Payer: Managed Care, Other (non HMO) | Admitting: Nurse Practitioner

## 2021-10-23 ENCOUNTER — Encounter: Payer: Self-pay | Admitting: Nurse Practitioner

## 2021-10-23 ENCOUNTER — Other Ambulatory Visit: Payer: Self-pay

## 2021-10-23 VITALS — BP 114/70 | HR 67 | Temp 98.3°F | Resp 20 | Ht 67.0 in | Wt 176.0 lb

## 2021-10-23 DIAGNOSIS — K219 Gastro-esophageal reflux disease without esophagitis: Secondary | ICD-10-CM | POA: Diagnosis not present

## 2021-10-23 DIAGNOSIS — Z23 Encounter for immunization: Secondary | ICD-10-CM | POA: Diagnosis not present

## 2021-10-23 DIAGNOSIS — J4520 Mild intermittent asthma, uncomplicated: Secondary | ICD-10-CM | POA: Diagnosis not present

## 2021-10-23 DIAGNOSIS — F3342 Major depressive disorder, recurrent, in full remission: Secondary | ICD-10-CM | POA: Diagnosis not present

## 2021-10-23 MED ORDER — FLUTICASONE FUROATE-VILANTEROL 100-25 MCG/ACT IN AEPB: 1.0000 | INHALATION_SPRAY | Freq: Every day | RESPIRATORY_TRACT | 11 refills | Status: DC

## 2021-10-23 MED ORDER — ESCITALOPRAM OXALATE 10 MG PO TABS: 10.0000 mg | ORAL_TABLET | Freq: Every day | ORAL | 5 refills | Status: DC

## 2021-10-23 NOTE — Addendum Note (Signed)
Addended by: Cleda Daub on: 10/23/2021 04:23 PM   Modules accepted: Orders

## 2021-10-23 NOTE — Progress Notes (Signed)
Subjective:    Patient ID: Misty Butler, female    DOB: 1991-04-21, 30 y.o.   MRN: [id number]   Chief Complaint: medical management of chronic issues     HPI:  1. Recurrent major depressive disorder, in full remission [facility]) Patient is under a lot of stress with dx of her daughter with rare autoimmune disease called JDM. Feels like she needs to be on something Depression screen [facility] 2/9 10/23/2021 07/05/2021 10/05/2020  Decreased Interest 0 0 0  Down, Depressed, Hopeless 1 0 0  PHQ - 2 Score 1 0 0  Altered sleeping 0 - -  Tired, decreased energy 2 - -  Change in appetite 0 - -  Feeling bad or failure about yourself  2 - -  Trouble concentrating 2 - -  Moving slowly or fidgety/restless 0 - -  Suicidal thoughts 0 - -  PHQ-9 Score 7 - -  Difficult doing work/chores Somewhat difficult - -     2. Gastroesophageal reflux disease, unspecified whether esophagitis present Has not been needing protonix.  3. Mild intermittent asthma without complication Is on BREO daily and is doing well. Has not need her albuterol lately.    Outpatient Encounter Medications as of 10/23/2021  Medication Sig   albuterol (VENTOLIN HFA) 108 (90 Base) MCG/ACT inhaler INHALE 2 PUFFS BY MOUTH INTO THE LUNGS EVERY 6 HOURS AS NEEDED FOR WHEEZE OR SHORTNESS OF BREATH   erythromycin ophthalmic ointment Place 1 application into the right eye at bedtime.   fexofenadine (ALLEGRA) 180 MG tablet Take 180 mg by mouth daily.   fluticasone furoate-vilanterol (BREO ELLIPTA) 100-25 MCG/INH AEPB USE 1 INHALATION DAILY (NEEDS TO BE SEEN BEFORE NEXT REFILL)   levonorgestrel (KYLEENA) 19.5 MG IUD 19.5 mg by Intrauterine route once.    pantoprazole (PROTONIX) 40 MG tablet TAKE 1 TABLET BY MOUTH EVERY DAY   No facility-administered encounter medications on file as of 10/23/2021.    Past Surgical History:  Procedure Laterality Date   BIOPSY  01/05/2020   Procedure: BIOPSY;  Surgeon: West Bali, MD;  Location: [facility]  ENDO SU[facility];  Service: Endoscopy;;   COLONOSCOPY WITH PROPOFOL N/A 01/05/2020   Procedure: COLONOSCOPY WITH PROPOFOL;  Surgeon: West Bali, MD;  Location: [facility] ENDO [facility];  Service: Endoscopy;  Laterality: N/A;  2:00    Family History  Problem Relation Age of Onset   Migraines Mother    Gout Father    Autism Sister    Colon cancer Other        passed in 60s   Colon cancer Other        passed in 80s    New complaints: None today  Social history: Lives with husband and 2 daughters.  Controlled substance contract: n/a     Review of Systems  Constitutional:  Negative for diaphoresis.  Eyes:  Negative for pain.  Respiratory:  Negative for shortness of breath.   Cardiovascular:  Negative for chest pain, palpitations and leg swelling.  Gastrointestinal:  Negative for abdominal pain.  Endocrine: Negative for polydipsia.  Skin:  Negative for rash.  Neurological:  Negative for dizziness, weakness and headaches.  Hematological:  Does not bruise/bleed easily.  All other systems reviewed and are negative.     Objective:   Physical Exam Vitals and nursing note reviewed.  Constitutional:      General: She is not in acute distress.    Appearance: Normal appearance. She is well-developed.  HENT:     Head: Normocephalic.  Right Ear: Tympanic membrane normal.     Left Ear: Tympanic membrane normal.     Nose: Nose normal.     Mouth/Throat:     Mouth: Mucous membranes are moist.  Eyes:     Pupils: Pupils are equal, round, and reactive to light.  Neck:     Vascular: No carotid bruit or JVD.  Cardiovascular:     Rate and Rhythm: Normal rate and regular rhythm.     Heart sounds: Normal heart sounds.  Pulmonary:     Effort: Pulmonary effort is normal. No respiratory distress.     Breath sounds: Normal breath sounds. No wheezing or rales.  Chest:     Chest wall: No tenderness.  Abdominal:     General: Bowel sounds are normal. There is no distension or abdominal bruit.      Palpations: Abdomen is soft. There is no hepatomegaly, splenomegaly, mass or pulsatile mass.     Tenderness: There is no abdominal tenderness.  Musculoskeletal:        General: Normal range of motion.     Cervical back: Normal range of motion and neck supple.  Lymphadenopathy:     Cervical: No cervical adenopathy.  Skin:    General: Skin is warm and dry.  Neurological:     Mental Status: She is alert and oriented to person, place, and time.     Deep Tendon Reflexes: Reflexes are normal and symmetric.  Psychiatric:        Behavior: Behavior normal.        Thought Content: Thought content normal.        Judgment: Judgment normal.    BP 114/70   Pulse 67   Temp 98.3 F (36.8 C) (Temporal)   Resp 20   Ht 5\' 7"  (1.702 m)   Wt 176 lb (79.8 kg)   SpO2 99%   BMI 27.57 kg/m        Assessment & Plan:   Misty Butler comes in today with chief complaint of Medical Management of Chronic Issues   Diagnosis and orders addressed:  1. Recurrent major depressive disorder, in full remission (HCC) Stress management Added lexapro - escitalopram (LEXAPRO) 10 MG tablet; Take 1 tablet (10 mg total) by mouth daily.  Dispense: 30 tablet; Refill: 5  2. Gastroesophageal reflux disease, unspecified whether esophagitis present Avoid spicy foods Do not eat 2 hours prior to bedtime   3. Mild intermittent asthma without complication Continue inhlalers as prescribed   Labs pending Health Maintenance reviewed Diet and exercise encouraged  Follow up plan: prn   Mary-Margaret [name], FNP

## 2021-10-23 NOTE — Patient Instructions (Signed)
Stress, Adult Stress is a normal reaction to life events. Stress is what you feel when life demands more than you are used to, or more than you think you can handle. Some stress can be useful, such as studying for a test or meeting a deadline at work. Stress that occurs too often or for too long can cause problems. It can affect your emotional health and interfere with relationships and normal daily activities. Too much stress can weaken your body's defense system (immune system) and increase your risk for physical illness. If you already have a medical problem, stress can make it worse. What are the causes? All sorts of life events can cause stress. An event that causes stress for one person may not be stressful for another person. Major life events, whether positive or negative, commonly cause stress. Examples include: Losing a job or starting a new job. Losing a loved one. Moving to a new town or home. Getting married or divorced. Having a baby. Getting injured or sick. Less obvious life events can also cause stress, especially if they occur day after day or in combination with each other. Examples include: Working long hours. Driving in traffic. Caring for children. Being in debt. Being in a difficult relationship. What are the signs or symptoms? Stress can cause emotional symptoms, including: Anxiety. This is feeling worried, afraid, on edge, overwhelmed, or out of control. Anger, including irritation or impatience. Depression. This is feeling sad, down, helpless, or guilty. Trouble focusing, remembering, or making decisions. Stress can cause physical symptoms, including: Aches and pains. These may affect your head, neck, back, stomach, or other areas of your body. Tight muscles or a clenched jaw. Low energy. Trouble sleeping. Stress can cause unhealthy behaviors, including: Eating to feel better (overeating) or skipping meals. Working too much or putting off tasks. Smoking,  drinking alcohol, or using drugs to feel better. How is this diagnosed? Stress is diagnosed through an assessment by your health care provider. He or she may diagnose this condition based on: Your symptoms and any stressful life events. Your medical history. Tests to rule out other causes of your symptoms. Depending on your condition, your health care provider may refer you to a specialist for further evaluation. How is this treated? Stress management techniques are the recommended treatment for stress. Medicine is not typically recommended for the treatment of stress. Techniques to reduce your reaction to stressful life events include: Stress identification. Monitor yourself for symptoms of stress and identify what causes stress for you. These skills may help you to avoid or prepare for stressful events. Time management. Set your priorities, keep a calendar of events, and learn to say no. Taking these actions can help you avoid making too many commitments. Techniques for coping with stress include: Rethinking the problem. Try to think realistically about stressful events rather than ignoring them or overreacting. Try to find the positives in a stressful situation rather than focusing on the negatives. Exercise. Physical exercise can release both physical and emotional tension. The key is to find a form of exercise that you enjoy and do it regularly. Relaxation techniques. These relax the body and mind. The key is to find one or more that you enjoy and use the techniques regularly. Examples include: Meditation, deep breathing, or progressive relaxation techniques. Yoga or tai chi. Biofeedback, mindfulness techniques, or journaling. Listening to music, being out in nature, or participating in other hobbies. Practicing a healthy lifestyle. Eat a balanced diet, drink plenty of water, limit or  avoid caffeine, and get plenty of sleep. Having a strong support network. Spend time with family, friends,  or other people you enjoy being around. Express your feelings and talk things over with someone you trust. Counseling or talk therapy with a mental health professional may be helpful if you are having trouble managing stress on your own. Follow these instructions at home: Lifestyle  Avoid drugs. Do not use any products that contain nicotine or tobacco, such as cigarettes, e-cigarettes, and chewing tobacco. If you need help quitting, ask your health care provider. Limit alcohol intake to no more than 1 drink a day for nonpregnant women and 2 drinks a day for men. One drink equals 12 oz of beer, 5 oz of wine, or 1 oz of hard liquor Do not use alcohol or drugs to relax. Eat a balanced diet that includes fresh fruits and vegetables, whole grains, lean meats, fish, eggs, and beans, and low-fat dairy. Avoid processed foods and foods high in added fat, sugar, and salt. Exercise at least 30 minutes on 5 or more days each week. Get 7-8 hours of sleep each night. General instructions  Practice stress management techniques as discussed with your health care provider. Drink enough fluid to keep your urine clear or pale yellow. Take over-the-counter and prescription medicines only as told by your health care provider. Keep all follow-up visits as told by your health care provider. This is important. Contact a health care provider if: Your symptoms get worse. You have new symptoms. You feel overwhelmed by your problems and can no longer manage them on your own. Get help right away if: You have thoughts of hurting yourself or others. If you ever feel like you may hurt yourself or others, or have thoughts about taking your own life, get help right away. You can go to your nearest emergency department or call: Your local emergency services (911 in the [place] A suicide crisis helpline, such as the [place] at (410)025-6148. This is open 24 hours a day. Summary Stress is a  normal reaction to life events. It can cause problems if it happens too often or for too long. Practicing stress management techniques is the best way to treat stress. Counseling or talk therapy with a mental health professional may be helpful if you are having trouble managing stress on your own. This information is not intended to replace advice given to you by your health care provider. Make sure you discuss any questions you have with your health care provider. Document Revised: 02/24/2021 Document Reviewed: 09/02/2020 Elsevier Patient Education  2022 [employer].

## 2021-12-12 ENCOUNTER — Telehealth: Payer: Self-pay | Admitting: Nurse Practitioner

## 2021-12-12 NOTE — Telephone Encounter (Signed)
Pt aware appt made

## 2021-12-15 ENCOUNTER — Other Ambulatory Visit (HOSPITAL_COMMUNITY)
Admission: RE | Admit: 2021-12-15 | Discharge: 2021-12-15 | Disposition: A | Discharge status: Home / Self Care | Payer: Managed Care, Other (non HMO) | Source: Ambulatory Visit | Attending: Nurse Practitioner | Admitting: Nurse Practitioner

## 2021-12-15 ENCOUNTER — Ambulatory Visit (INDEPENDENT_AMBULATORY_CARE_PROVIDER_SITE_OTHER): Payer: Managed Care, Other (non HMO) | Admitting: Nurse Practitioner

## 2021-12-15 ENCOUNTER — Other Ambulatory Visit: Payer: Self-pay

## 2021-12-15 ENCOUNTER — Encounter: Payer: Self-pay | Admitting: Nurse Practitioner

## 2021-12-15 ENCOUNTER — Other Ambulatory Visit: Payer: Self-pay | Admitting: Nurse Practitioner

## 2021-12-15 VITALS — BP 101/67 | HR 87 | Temp 96.7°F | Resp 20 | Ht 67.0 in | Wt 180.0 lb

## 2021-12-15 DIAGNOSIS — J4541 Moderate persistent asthma with (acute) exacerbation: Secondary | ICD-10-CM

## 2021-12-15 DIAGNOSIS — Z30432 Encounter for removal of intrauterine contraceptive device: Secondary | ICD-10-CM

## 2021-12-15 DIAGNOSIS — Z01411 Encounter for gynecological examination (general) (routine) with abnormal findings: Secondary | ICD-10-CM | POA: Diagnosis not present

## 2021-12-15 DIAGNOSIS — Z01419 Encounter for gynecological examination (general) (routine) without abnormal findings: Secondary | ICD-10-CM

## 2021-12-15 MED ORDER — ALBUTEROL SULFATE (2.5 MG/3ML) 0.083% IN NEBU: 2.5000 mg | INHALATION_SOLUTION | RESPIRATORY_TRACT | 2 refills | Status: AC | PRN

## 2021-12-15 MED ORDER — METHYLPREDNISOLONE ACETATE 80 MG/ML IJ SUSP
80.0000 mg | Freq: Once | INTRAMUSCULAR | Status: AC
  Administered 2021-12-15: 80 mg via INTRAMUSCULAR

## 2021-12-15 NOTE — Progress Notes (Signed)
Subjective:    Patient ID: Misty Butler, female    DOB: Apr 19, 1991, 30 y.o.   MRN: [id number]   Chief Complaint: IUD removal  HPI Patient comes in today for PAP and IUD removal. She is doing well. Has not had a period since havig IUD until recently and she has ben bleeding daily. She does not want another IUD inserted.  Only complaint today is that her asthma has been flaring up lately. Having to use her albuterol more frequently.    Review of Systems  Constitutional:  Negative for diaphoresis.  Eyes:  Negative for pain.  Respiratory:  Negative for shortness of breath.   Cardiovascular:  Negative for chest pain, palpitations and leg swelling.  Gastrointestinal:  Negative for abdominal pain.  Endocrine: Negative for polydipsia.  Skin:  Negative for rash.  Neurological:  Negative for dizziness, weakness and headaches.  Hematological:  Does not bruise/bleed easily.  All other systems reviewed and are negative.     Objective:   Physical Exam Vitals and nursing note reviewed.  Constitutional:      General: She is not in acute distress.    Appearance: Normal appearance. She is well-developed.  HENT:     Head: Normocephalic.     Right Ear: Tympanic membrane normal.     Left Ear: Tympanic membrane normal.     Nose: Nose normal.     Mouth/Throat:     Mouth: Mucous membranes are moist.  Eyes:     Pupils: Pupils are equal, round, and reactive to light.  Neck:     Vascular: No carotid bruit or JVD.  Cardiovascular:     Rate and Rhythm: Normal rate and regular rhythm.     Heart sounds: Normal heart sounds.  Pulmonary:     Effort: Pulmonary effort is normal. No respiratory distress.     Breath sounds: Wheezing (faint exp) present. No rales.  Chest:     Chest wall: No tenderness.  Abdominal:     General: Bowel sounds are normal. There is no distension or abdominal bruit.     Palpations: Abdomen is soft. There is no hepatomegaly, splenomegaly, mass or pulsatile mass.      Tenderness: There is no abdominal tenderness.  Genitourinary:    General: Normal vulva.     Vagina: No vaginal discharge.     Rectum: Normal.     Comments: Mirena removed- intact Cervix parous and pink No adnexal masses or tenderness  Musculoskeletal:        General: Normal range of motion.     Cervical back: Normal range of motion and neck supple.  Lymphadenopathy:     Cervical: No cervical adenopathy.  Skin:    General: Skin is warm and dry.  Neurological:     Mental Status: She is alert and oriented to person, place, and time.     Deep Tendon Reflexes: Reflexes are normal and symmetric.  Psychiatric:        Behavior: Behavior normal.        Thought Content: Thought content normal.        Judgment: Judgment normal.    BP 101/67    Pulse 87    Temp (!) 96.7 F (35.9 C)    Resp 20    Ht 5\' 7"  (1.702 m)    Wt 180 lb (81.6 kg)    LMP 11/24/2021 Comment: spotting daily with IUD   SpO2 99%    BMI 28.19 kg/m  Assessment & Plan:  Misty Butler comes in today with chief complaint of Annual Exam   Diagnosis and orders addressed:  1. Gynecologic exam normal - Cytology - PAP  2. Encounter for IUD removal Removed with no problems  3. Moderate persistent asthma with acute exacerbation Force fluids Run humidifier - methylPREDNISolone acetate (DEPO-MEDROL) injection 80 mg   Labs pending Health Maintenance reviewed Diet and exercise encouraged  Follow up plan: prn   Mary-Margaret Daphine Deutscher, FNP

## 2021-12-15 NOTE — Patient Instructions (Signed)
Asthma, Adult ?Asthma is a long-term (chronic) condition in which the airways get tight and narrow. The airways are the breathing passages that lead from the nose and mouth down into the lungs. A person with asthma will have times when symptoms get worse. These are called asthma attacks. They can cause coughing, whistling sounds when you breathe (wheezing), shortness of breath, and chest pain. They can make it hard to breathe. There is no cure for asthma, but medicines and lifestyle changes can help control it. ?There are many things that can bring on an asthma attack or make asthma symptoms worse (triggers). Common triggers include: ?Mold. ?Dust. ?Cigarette smoke. ?Cockroaches. ?Things that can cause allergy symptoms (allergens). These include animal skin flakes (dander) and pollen from trees or grass. ?Things that pollute the air. These may include household cleaners, wood smoke, smog, or chemical odors. ?Cold air, weather changes, and wind. ?Crying or laughing hard. ?Stress. ?Certain medicines or drugs. ?Certain foods such as dried fruit, potato chips, and grape juice. ?Infections, such as a cold or the flu. ?Certain medical conditions or diseases. ?Exercise or tiring activities. ?Asthma may be treated with medicines and by staying away from the things that cause asthma attacks. Types of medicines may include: ?Controller medicines. These help prevent asthma symptoms. They are usually taken every day. ?Fast-acting reliever or rescue medicines. These quickly relieve asthma symptoms. They are used as needed and provide short-term relief. ?Allergy medicines if your attacks are brought on by allergens. ?Medicines to help control the body's defense (immune) system. ?Follow these instructions at home: ?Avoiding triggers in your home ?Change your heating and air conditioning filter often. ?Limit your use of fireplaces and wood stoves. ?Get rid of pests (such as roaches and mice) and their droppings. ?Throw away plants  if you see mold on them. ?Clean your floors. Dust regularly. Use cleaning products that do not smell. ?Have someone vacuum when you are not home. Use a vacuum cleaner with a HEPA filter if possible. ?Replace carpet with wood, tile, or vinyl flooring. Carpet can trap animal skin flakes and dust. ?Use allergy-proof pillows, mattress covers, and box spring covers. ?Wash bed sheets and blankets every week in hot water. Dry them in a dryer. ?Keep your bedroom free of any triggers. ?Avoid pets and keep windows closed when things that cause allergy symptoms are in the air. ?Use blankets that are made of polyester or cotton. ?Clean bathrooms and kitchens with bleach. If possible, have someone repaint the walls in these rooms with mold-resistant paint. Keep out of the rooms that are being cleaned and painted. ?Wash your hands often with soap and water. If soap and water are not available, use hand sanitizer. ?Do not allow anyone to smoke in your home. ?General instructions ?Take over-the-counter and prescription medicines only as told by your doctor. ?Talk with your doctor if you have questions about how or when to take your medicines. ?Make note if you need to use your medicines more often than usual. ?Do not use any products that contain nicotine or tobacco, such as cigarettes and e-cigarettes. If you need help quitting, ask your doctor. ?Stay away from secondhand smoke. ?Avoid doing things outdoors when allergen counts are high and when air quality is low. ?Wear a ski mask when doing outdoor activities in the winter. The mask should cover your nose and mouth. Exercise indoors on cold days if you can. ?Warm up before you exercise. Take time to cool down after exercise. ?Use a peak flow meter as   told by your doctor. A peak flow meter is a tool that measures how well the lungs are working. ?Keep track of the peak flow meter's readings. Write them down. ?Follow your asthma action plan. This is a written plan for taking care  of your asthma and treating your attacks. ?Make sure you get all the shots (vaccines) that your doctor recommends. Ask your doctor about a flu shot and a pneumonia shot. ?Keep all follow-up visits as told by your doctor. This is important. ?Contact a doctor if: ?You have wheezing, shortness of breath, or a cough even while taking medicine to prevent attacks. ?The mucus you cough up (sputum) is thicker than usual. ?The mucus you cough up changes from clear or white to yellow, green, gray, or bloody. ?You have problems from the medicine you are taking, such as: ?A rash. ?Itching. ?Swelling. ?Trouble breathing. ?You need reliever medicines more than 2-3 times a week. ?Your peak flow reading is still at 50-79% of your personal best after following the action plan for 1 hour. ?You have a fever. ?Get help right away if: ?You seem to be worse and are not responding to medicine during an asthma attack. ?You are short of breath even at rest. ?You get short of breath when doing very little activity. ?You have trouble eating, drinking, or talking. ?You have chest pain or tightness. ?You have a fast heartbeat. ?Your lips or fingernails start to turn blue. ?You are light-headed or dizzy, or you faint. ?Your peak flow is less than 50% of your personal best. ?You feel too tired to breathe normally. ?Summary ?Asthma is a long-term (chronic) condition in which the airways get tight and narrow. An asthma attack can make it hard to breathe. ?Asthma cannot be cured, but medicines and lifestyle changes can help control it. ?Make sure you understand how to avoid triggers and how and when to use your medicines. ?This information is not intended to replace advice given to you by your health care provider. Make sure you discuss any questions you have with your health care provider. ?Document Revised: 04/10/2020 Document Reviewed: 04/20/2020 ?Elsevier Patient Education ? 2022 [employer] ? ?

## 2021-12-21 LAB — CYTOLOGY - PAP
Chlamydia: NEGATIVE
Comment: NEGATIVE
Comment: NEGATIVE
Comment: NEGATIVE
Comment: NORMAL
Diagnosis: NEGATIVE
High risk HPV: NEGATIVE
Neisseria Gonorrhea: NEGATIVE
Trichomonas: NEGATIVE

## 2022-02-02 ENCOUNTER — Other Ambulatory Visit: Payer: Self-pay | Admitting: Nurse Practitioner

## 2022-05-03 ENCOUNTER — Ambulatory Visit (INDEPENDENT_AMBULATORY_CARE_PROVIDER_SITE_OTHER): Payer: BLUE CROSS/BLUE SHIELD

## 2022-05-03 ENCOUNTER — Encounter: Payer: Self-pay | Admitting: Nurse Practitioner

## 2022-05-03 ENCOUNTER — Ambulatory Visit: Payer: BLUE CROSS/BLUE SHIELD | Admitting: Nurse Practitioner

## 2022-05-03 VITALS — BP 102/64 | HR 65 | Temp 97.7°F | Resp 20 | Ht 67.0 in | Wt 182.0 lb

## 2022-05-03 DIAGNOSIS — M79674 Pain in right toe(s): Secondary | ICD-10-CM

## 2022-05-03 DIAGNOSIS — M79671 Pain in right foot: Secondary | ICD-10-CM | POA: Diagnosis not present

## 2022-05-03 MED ORDER — PREDNISONE 20 MG PO TABS: 40.0000 mg | ORAL_TABLET | Freq: Every day | ORAL | 0 refills | Status: AC

## 2022-05-03 NOTE — Progress Notes (Signed)
? ?Subjective:  ? ? Patient ID: Misty Butler, female    DOB: 14-Feb-1991, 31 y.o.   MRN: [id number] ? ? ?Chief Complaint: toe pain ? ?HPI ?Patient comes in today c/o toe pain. Pain is at the base of her right 2nd,3rd and 4th toe. Shoes seem to aggravate it. Sitting and resting and taking shoes off helps. Rates pain range to be 3-6/10. She denies injury. ? ? ? ?Review of Systems  ?Constitutional:  Negative for diaphoresis.  ?Eyes:  Negative for pain.  ?Respiratory:  Negative for shortness of breath.   ?Cardiovascular:  Negative for chest pain, palpitations and leg swelling.  ?Gastrointestinal:  Negative for abdominal pain.  ?Endocrine: Negative for polydipsia.  ?Skin:  Negative for rash.  ?Neurological:  Negative for dizziness, weakness and headaches.  ?Hematological:  Does not bruise/bleed easily.  ?All other systems reviewed and are negative. ? ?   ?Objective:  ? Physical Exam ?Vitals and nursing note reviewed.  ?Constitutional:   ?   General: She is not in acute distress. ?   Appearance: Normal appearance. She is well-developed.  ?Neck:  ?   Vascular: No carotid bruit or JVD.  ?Cardiovascular:  ?   Rate and Rhythm: Normal rate and regular rhythm.  ?   Heart sounds: Normal heart sounds.  ?Pulmonary:  ?   Effort: Pulmonary effort is normal. No respiratory distress.  ?   Breath sounds: Normal breath sounds. No wheezing or rales.  ?Chest:  ?   Chest wall: No tenderness.  ?Abdominal:  ?   General: There is no distension or abdominal bruit.  ?   Palpations: There is no hepatomegaly, splenomegaly, mass or pulsatile mass.  ?   Tenderness: There is no abdominal tenderness.  ?Musculoskeletal:     ?   General: Normal range of motion.  ?   Cervical back: Normal range of motion and neck supple.  ?   Comments: FROM of all toes on right foot. No pain on palpation.  ?Lymphadenopathy:  ?   Cervical: No cervical adenopathy.  ?Skin: ?   General: Skin is warm and dry.  ?Neurological:  ?   Mental Status: She is alert and oriented to  person, place, and time.  ?   Deep Tendon Reflexes: Reflexes are normal and symmetric.  ?Psychiatric:     ?   Behavior: Behavior normal.     ?   Thought Content: Thought content normal.     ?   Judgment: Judgment normal.  ? ? ?BP 102/64   Pulse 65   Temp 97.7 ?F (36.5 ?C) (Temporal)   Resp 20   Ht 5\' 7"  (1.702 m)   Wt 182 lb (82.6 kg)   SpO2 98%   BMI 28.51 kg/m?  ? ?Right foot xray- no acute findings-Preliminary reading by Ronnald Collum, FNP  [facility] ? ? ?   ?Assessment & Plan:  ? ?Misty Butler in today with chief complaint of Right foot pain (At base of toes/) ? ? ?1. Pain of toe of right foot ?Soak in warm epsom salt bid ?If hurting stop and rest ? ?- DG Foot Complete Right ? ?Meds ordered this encounter  ?Medications  ? predniSONE (DELTASONE) 20 MG tablet  ?  Sig: Take 2 tablets (40 mg total) by mouth daily with breakfast for 5 days. 2 po daily for 5 days  ?  Dispense:  10 tablet  ?  Refill:  0  ?  Order Specific Question:   Supervising Provider  ?  Answer:   Caryl Pina A [id number]  ? ? ? ?The above assessment and management plan was discussed with the patient. The patient verbalized understanding of and has agreed to the management plan. Patient is aware to call the clinic if symptoms persist or worsen. Patient is aware when to return to the clinic for a follow-up visit. Patient educated on when it is appropriate to go to the emergency department.  ? ?Mary-Margaret Hassell Done, FNP ? ? ?

## 2022-05-03 NOTE — Patient Instructions (Signed)

## 2022-08-11 ENCOUNTER — Other Ambulatory Visit: Payer: Self-pay | Admitting: Nurse Practitioner

## 2022-08-28 ENCOUNTER — Other Ambulatory Visit: Payer: Self-pay | Admitting: Nurse Practitioner

## 2022-09-06 ENCOUNTER — Encounter: Payer: Self-pay | Admitting: Family

## 2022-09-06 ENCOUNTER — Telehealth (INDEPENDENT_AMBULATORY_CARE_PROVIDER_SITE_OTHER): Payer: BLUE CROSS/BLUE SHIELD | Admitting: Family

## 2022-09-06 DIAGNOSIS — J4541 Moderate persistent asthma with (acute) exacerbation: Secondary | ICD-10-CM

## 2022-09-06 MED ORDER — PREDNISONE 10 MG (21) PO TBPK: ORAL_TABLET | ORAL | 0 refills | Status: DC

## 2022-09-06 NOTE — Progress Notes (Signed)
Virtual Visit Consent   Misty Butler, you are scheduled for a virtual visit with a [facility] provider today. Just as with appointments in the office, your consent must be obtained to participate. Your consent will be active for this visit and any virtual visit you may have with one of our providers in the next 365 days. If you have a MyChart account, a copy of this consent can be sent to you electronically.  As this is a virtual visit, video technology does not allow for your provider to perform a traditional examination. This may limit your provider's ability to fully assess your condition. If your provider identifies any concerns that need to be evaluated in person or the need to arrange testing (such as labs, EKG, etc.), we will make arrangements to do so. Although advances in technology are sophisticated, we cannot ensure that it will always work on either your end or our end. If the connection with a video visit is poor, the visit may have to be switched to a telephone visit. With either a video or telephone visit, we are not always able to ensure that we have a secure connection.  By engaging in this virtual visit, you consent to the provision of healthcare and authorize for your insurance to be billed (if applicable) for the services provided during this visit. Depending on your insurance coverage, you may receive a charge related to this service.  I need to obtain your verbal consent now. Are you willing to proceed with your visit today? Misty Butler has provided verbal consent on 09/06/2022 for a virtual visit (video or telephone). Jannifer Rodney, FNP  Date: 09/06/2022 12:48 PM  Virtual Visit via Video Note   I, Jannifer Rodney, connected with  Misty Butler  ([id number], [birth year]) on 09/06/22 at  1:55 PM EDT by a video-enabled telemedicine application and verified that I am speaking with the correct person using two identifiers.  Location: Patient: Virtual Visit Location Patient:  Home Provider: Virtual Visit Location Provider: Home Office   I discussed the limitations of evaluation and management by telemedicine and the availability of in person appointments. The patient expressed understanding and agreed to proceed.    History of Present Illness: Misty Butler is a 31 y.o. who identifies as a female who was assigned female at birth, and is being seen today for asthma flare up. Reports she usually only has to use albuterol once a month, but recently having to use several times a day.   HPI: Asthma She complains of chest tightness, cough, difficulty breathing, frequent throat clearing, shortness of breath and wheezing. This is a recurrent problem. The current episode started 1 to 4 weeks ago. The cough is non-productive. Associated symptoms include dyspnea on exertion and nasal congestion. Pertinent negatives include no ear congestion, ear pain, fever, myalgias or sore throat. Her symptoms are alleviated by rest. She reports minimal improvement on treatment. Her past medical history is significant for asthma.    Problems:  Patient Active Problem List   Diagnosis Date Noted   Hemoptysis 07/05/2021   Mild intermittent asthma without complication 10/05/2020   Gastroesophageal reflux disease 12/18/2019   Depression 06/02/2013    Allergies:  Allergies  Allergen Reactions   Zithromax [Azithromycin]     Lightheaded and black out   Medications:  Current Outpatient Medications:    predniSONE (STERAPRED UNI-PAK 21 TAB) 10 MG (21) TBPK tablet, Use as directed, Disp: 21 tablet, Rfl: 0   albuterol (PROVENTIL) (2.5 MG/3ML)  0.083% nebulizer solution, Take 3 mLs (2.5 mg total) by nebulization every 4 (four) hours as needed for wheezing or shortness of breath., Disp: 75 mL, Rfl: 2   albuterol (VENTOLIN HFA) 108 (90 Base) MCG/ACT inhaler, USE 2 PUFFS EVERY 6 HOURS AS NEEDED, Disp: 8.5 g, Rfl: 2   fexofenadine (ALLEGRA) 180 MG tablet, Take 180 mg by mouth daily., Disp: , Rfl:     fluticasone furoate-vilanterol (BREO ELLIPTA) 100-25 MCG/ACT AEPB, Inhale 1 puff into the lungs daily. (NEEDS TO BE SEEN BEFORE NEXT REFILL), Disp: 60 each, Rfl: 0  Observations/Objective: Patient is well-developed, well-nourished in no acute distress.  Resting comfortably  at home.  Head is normocephalic, atraumatic.  No labored breathing.  Speech is clear and coherent with logical content.  Patient is alert and oriented at baseline.    Assessment and Plan: 1. Moderate persistent asthma with acute exacerbation - predniSONE (STERAPRED UNI-PAK 21 TAB) 10 MG (21) TBPK tablet; Use as directed  Dispense: 21 tablet; Refill: 0  Continue Allegra and Breo daily Avoid allergens Albuterol as needed Keep follow up with PCP  Follow Up Instructions: I discussed the assessment and treatment plan with the patient. The patient was provided an opportunity to ask questions and all were answered. The patient agreed with the plan and demonstrated an understanding of the instructions.  A copy of instructions were sent to the patient via MyChart unless otherwise noted below.     The patient was advised to call back or seek an in-person evaluation if the symptoms worsen or if the condition fails to improve as anticipated.  Time:  I spent 6 minutes with the patient via telehealth technology discussing the above problems/concerns.    Jannifer Rodney, FNP

## 2022-09-10 DIAGNOSIS — Z6829 Body mass index (BMI) 29.0-29.9, adult: Secondary | ICD-10-CM | POA: Diagnosis not present

## 2022-09-10 DIAGNOSIS — J4520 Mild intermittent asthma, uncomplicated: Secondary | ICD-10-CM | POA: Diagnosis not present

## 2022-09-10 DIAGNOSIS — J209 Acute bronchitis, unspecified: Secondary | ICD-10-CM | POA: Diagnosis not present

## 2022-09-11 DIAGNOSIS — Z87891 Personal history of nicotine dependence: Secondary | ICD-10-CM | POA: Diagnosis not present

## 2022-09-11 DIAGNOSIS — J4541 Moderate persistent asthma with (acute) exacerbation: Secondary | ICD-10-CM | POA: Diagnosis not present

## 2022-09-14 ENCOUNTER — Other Ambulatory Visit: Payer: Self-pay | Admitting: Nurse Practitioner

## 2022-09-18 ENCOUNTER — Encounter: Payer: Self-pay | Admitting: Nurse Practitioner

## 2022-09-18 ENCOUNTER — Ambulatory Visit: Payer: BLUE CROSS/BLUE SHIELD | Admitting: Nurse Practitioner

## 2022-09-18 ENCOUNTER — Ambulatory Visit (INDEPENDENT_AMBULATORY_CARE_PROVIDER_SITE_OTHER): Payer: BLUE CROSS/BLUE SHIELD

## 2022-09-18 VITALS — BP 107/73 | HR 77 | Temp 98.2°F | Resp 20 | Ht 67.0 in | Wt 189.0 lb

## 2022-09-18 DIAGNOSIS — J4541 Moderate persistent asthma with (acute) exacerbation: Secondary | ICD-10-CM | POA: Diagnosis not present

## 2022-09-18 DIAGNOSIS — R0602 Shortness of breath: Secondary | ICD-10-CM | POA: Diagnosis not present

## 2022-09-18 MED ORDER — BUDESONIDE-FORMOTEROL FUMARATE 160-4.5 MCG/ACT IN AERO: 2.0000 | INHALATION_SPRAY | Freq: Two times a day (BID) | RESPIRATORY_TRACT | 3 refills | Status: DC

## 2022-09-18 NOTE — Patient Instructions (Signed)
Asthma, Adult  Asthma is a condition that causes swelling and narrowing of the airways. These are the passages that lead from the nose and mouth down into the lungs. When asthma symptoms get worse it is called an asthma attack or flare. This can make it hard to breathe. Asthma flares can range from minor to life-threatening. There is no cure for asthma, but medicines and lifestyle changes can help to control it. What are the causes? It is not known exactly what causes asthma, but certain things can cause asthma symptoms to get worse (triggers). What can trigger an asthma attack? Cigarette smoke. Mold. Dust. Your pet's skin flakes (dander). Cockroaches. Pollen. Air pollution (like household cleaners, wood smoke, smog, or chemical odors). What are the signs or symptoms? Trouble breathing (shortness of breath). Coughing. Making high-pitched whistling sounds when you breathe, most often when you breathe out (wheezing). Chest tightness. Tiredness with little activity. Poor exercise tolerance. How is this treated? Controller medicines that help prevent asthma symptoms. Fast-acting reliever or rescue medicines. These give short-term relief of asthma symptoms. Allergy medicines if your attacks are brought on by allergens. Medicines to help control the body's defense (immune) system. Staying away from the things that cause asthma attacks. Follow these instructions at home: Avoiding triggers in your home Do not allow anyone to smoke in your home. Limit use of fireplaces and wood stoves. Get rid of pests (such as roaches and mice) and their droppings. Keep your home clean. Clean your floors. Dust regularly. Use cleaning products that do not smell. Wash bed sheets and blankets every week in hot water. Dry them in a dryer. Have someone vacuum when you are not home. Change your heating and air conditioning filters often. Use blankets that are made of polyester or cotton. General  instructions Take over-the-counter and prescription medicines only as told by your doctor. Do not smoke or use any products that contain nicotine or tobacco. If you need help quitting, ask your doctor. Stay away from secondhand smoke. Avoid doing things outdoors when allergen counts are high and when air quality is low. Warm up before you exercise. Take time to cool down after exercise. Use a peak flow meter as told by your doctor. A peak flow meter is a tool that measures how well your lungs are working. Keep track of the peak flow meter's readings. Write them down. Follow your asthma action plan. This is a written plan for taking care of your asthma and treating your attacks. Make sure you get all the shots (vaccines) that your doctor recommends. Ask your doctor about a flu shot and a pneumonia shot. Keep all follow-up visits. Contact a doctor if: You have wheezing, shortness of breath, or a cough even while taking medicine to prevent attacks. The mucus you cough up (sputum) is thicker than usual. The mucus you cough up changes from clear or white to yellow, green, gray, or is bloody. You have problems from the medicine you are taking, such as: A rash. Itching. Swelling. Trouble breathing. You need reliever medicines more than 2-3 times a week. Your peak flow reading is still at 50-79% of your personal best after following the action plan for 1 hour. You have a fever. Get help right away if: You seem to be worse and are not responding to medicine during an asthma attack. You are short of breath even at rest. You get short of breath when doing very little activity. You have trouble eating, drinking, or talking. You have chest   pain or tightness. You have a fast heartbeat. Your lips or fingernails start to turn blue. You are light-headed or dizzy, or you faint. Your peak flow is less than 50% of your personal best. You feel too tired to breathe normally. These symptoms may be an  emergency. Get help right away. Call 911. Do not wait to see if the symptoms will go away. Do not drive yourself to the hospital. Summary Asthma is a long-term (chronic) condition in which the airways get tight and narrow. An asthma attack can make it hard to breathe. Asthma cannot be cured, but medicines and lifestyle changes can help control it. Make sure you understand how to avoid triggers and how and when to use your medicines. Avoid things that can cause allergy symptoms (allergens). These include animal skin flakes (dander) and pollen from trees or grass. Avoid things that pollute the air. These may include household cleaners, wood smoke, smog, or chemical odors. This information is not intended to replace advice given to you by your health care provider. Make sure you discuss any questions you have with your health care provider. Document Revised: 09/25/2021 Document Reviewed: 09/25/2021 Elsevier Patient Education  2023 Elsevier Inc.  

## 2022-09-18 NOTE — Progress Notes (Signed)
   Subjective:    Patient ID: Misty Butler, female    DOB: 14-Apr-1991, 31 y.o.   MRN: [id number]   Chief Complaint: Asthma (Saw a pulmonologist last week and not better/)   Asthma She complains of shortness of breath and wheezing. Pertinent negatives include no chest pain or headaches. Her past medical history is significant for asthma.   Patient has had trouble with her asthma the last couple of moths. She has seen her pulmonologist last week and singulair was added. She is having to use her rescue inhaler 2x a day. She does her BREO daily. She feels tight when she is trying to breathe. Feels like sheis spending all of her energy to breathe. She has not had a chest xray.  She thinks symbicort worked better for her then [employer].  Review of Systems  Constitutional:  Negative for diaphoresis.  Eyes:  Negative for pain.  Respiratory:  Positive for shortness of breath and wheezing.   Cardiovascular:  Negative for chest pain, palpitations and leg swelling.  Gastrointestinal:  Negative for abdominal pain.  Endocrine: Negative for polydipsia.  Skin:  Negative for rash.  Neurological:  Negative for dizziness, weakness and headaches.  Hematological:  Does not bruise/bleed easily.  All other systems reviewed and are negative.      Objective:   Physical Exam Vitals reviewed.  Constitutional:      Appearance: Normal appearance.  Cardiovascular:     Rate and Rhythm: Normal rate and regular rhythm.     Heart sounds: Normal heart sounds.  Pulmonary:     Effort: Respiratory distress (mild) present.     Breath sounds: Wheezing (faint insp wheezes bil.) present.  Skin:    General: Skin is warm.  Neurological:     General: No focal deficit present.     Mental Status: She is alert and oriented to person, place, and time.  Psychiatric:        Mood and Affect: Mood normal.        Behavior: Behavior normal.     BP 107/73   Pulse 77   Temp 98.2 F (36.8 C) (Temporal)   Resp 20   Ht 5'  7" (1.702 m)   Wt 189 lb (85.7 kg)   SpO2 95%   BMI 29.60 kg/m    Chest xray- chronic bronchial changes-Preliminary reading by Ronnald Collum, FNP  [facility]      Assessment & Plan:   Misty Butler in today with chief complaint of Asthma (Saw a pulmonologist last week and not better/)   1. Moderate persistent asthma with acute exacerbation Back on symbicort to see if helps Continue singulair RTO if not getting better. - DG Chest 2 View    The above assessment and management plan was discussed with the patient. The patient verbalized understanding of and has agreed to the management plan. Patient is aware to call the clinic if symptoms persist or worsen. Patient is aware when to return to the clinic for a follow-up visit. Patient educated on when it is appropriate to go to the emergency department.   Mary-Margaret Hassell Done, FNP

## 2022-10-30 ENCOUNTER — Telehealth: Payer: Self-pay | Admitting: Nurse Practitioner

## 2022-10-30 ENCOUNTER — Telehealth: Payer: BLUE CROSS/BLUE SHIELD | Admitting: Nurse Practitioner

## 2022-10-30 ENCOUNTER — Encounter: Payer: Self-pay | Admitting: Nurse Practitioner

## 2022-10-30 ENCOUNTER — Other Ambulatory Visit: Payer: Self-pay | Admitting: Nurse Practitioner

## 2022-10-30 DIAGNOSIS — U071 COVID-19: Secondary | ICD-10-CM | POA: Diagnosis not present

## 2022-10-30 MED ORDER — MOLNUPIRAVIR EUA 200MG CAPSULE: 4.0000 | ORAL_CAPSULE | Freq: Two times a day (BID) | ORAL | 0 refills | Status: AC

## 2022-10-30 MED ORDER — MOLNUPIRAVIR EUA 200MG CAPSULE: 4.0000 | ORAL_CAPSULE | Freq: Two times a day (BID) | ORAL | 0 refills | Status: DC

## 2022-10-30 NOTE — Telephone Encounter (Signed)
Your right I forgot- will send to [facility]

## 2022-10-30 NOTE — Patient Instructions (Signed)
[name], thank you for joining Chevis Pretty, FNP for today's virtual visit.  While this provider is not your primary care provider (PCP), if your PCP is located in our provider database this encounter information will be shared with them immediately following your visit.   [facility] account gives you access to today's visit and all your visits, tests, and labs performed at [facility] " click here if you don't have a [facility] account or go to [email or web address]  Consent: (Patient) Misty Butler provided verbal consent for this virtual visit at the beginning of the encounter.  Current Medications:  Current Outpatient Medications:    molnupiravir EUA (LAGEVRIO) 200 mg CAPS capsule, Take 4 capsules (800 mg total) by mouth 2 (two) times daily for 5 days., Disp: 40 capsule, Rfl: 0   albuterol (PROVENTIL) (2.5 MG/3ML) 0.083% nebulizer solution, Take 3 mLs (2.5 mg total) by nebulization every 4 (four) hours as needed for wheezing or shortness of breath., Disp: 75 mL, Rfl: 2   albuterol (VENTOLIN HFA) 108 (90 Base) MCG/ACT inhaler, USE 2 PUFFS EVERY 6 HOURS AS NEEDED, Disp: 8.5 g, Rfl: 2   budesonide-formoterol (SYMBICORT) 160-4.5 MCG/ACT inhaler, Inhale 2 puffs into the lungs 2 (two) times daily., Disp: 10.2 g, Rfl: 3   fexofenadine (ALLEGRA) 180 MG tablet, Take 180 mg by mouth daily., Disp: , Rfl:    montelukast (SINGULAIR) 10 MG tablet, Take 10 mg by mouth daily., Disp: , Rfl:    Medications ordered in this encounter:  Meds ordered this encounter  Medications   molnupiravir EUA (LAGEVRIO) 200 mg CAPS capsule    Sig: Take 4 capsules (800 mg total) by mouth 2 (two) times daily for 5 days.    Dispense:  40 capsule    Refill:  0    Order Specific Question:   Supervising Provider    Answer:   Caryl Pina A [[id number]]     *If you need refills on other medications prior to your next appointment, please contact your  pharmacy*  Follow-Up: Call back or seek an in-person evaluation if the symptoms worsen or if the condition fails to improve as anticipated.  [facility] [phone]  Other Instructions 1. Take meds as prescribed 2. Use a cool mist humidifier especially during the winter months and when heat has been humid. 3. Use saline nose sprays frequently 4. Saline irrigations of the nose can be very helpful if done frequently.  * 4X daily for 1 week*  * Use of a nettie pot can be helpful with this. Follow directions with this* 5. Drink plenty of fluids 6. Keep thermostat turn down low 7.For any cough or congestion- mucinex or delsym OTC 8. For fever or aces or pains- take tylenol or ibuprofen appropriate for age and weight.  * for fevers greater than 101 orally you may alternate ibuprofen and tylenol every  3 hours.      If you have been instructed to have an in-person evaluation today at a local Urgent Care facility, please use the link below. It will take you to a list of all of our available [facility], including address, phone number and hours of operation. Please do not delay care.  [facility]  If you or a family member do not have a primary care provider, use the link below to schedule a visit and establish care. When you choose a [facility] primary care physician or advanced practice provider,  you gain a long-term partner in health. Find a Primary Care Provider  Learn more about [facility]'s in-office and virtual care options: [facility]

## 2022-10-30 NOTE — Telephone Encounter (Signed)
Patient aware and verbalized understanding. °

## 2022-10-30 NOTE — Progress Notes (Signed)
Virtual Visit Consent   Misty Butler, you are scheduled for a virtual visit with Mary-Margaret Hassell Done, [place], a [facility] provider, today.     Just as with appointments in the office, your consent must be obtained to participate.  Your consent will be active for this visit and any virtual visit you may have with one of our providers in the next 365 days.     If you have a MyChart account, a copy of this consent can be sent to you electronically.  All virtual visits are billed to your insurance company just like a traditional visit in the office.    As this is a virtual visit, video technology does not allow for your provider to perform a traditional examination.  This may limit your provider's ability to fully assess your condition.  If your provider identifies any concerns that need to be evaluated in person or the need to arrange testing (such as labs, EKG, etc.), we will make arrangements to do so.     Although advances in technology are sophisticated, we cannot ensure that it will always work on either your end or our end.  If the connection with a video visit is poor, the visit may have to be switched to a telephone visit.  With either a video or telephone visit, we are not always able to ensure that we have a secure connection.     I need to obtain your verbal consent now.   Are you willing to proceed with your visit today? YES   Misty Butler has provided verbal consent on 10/30/2022 for a virtual visit (video or telephone).   Mary-Margaret Hassell Done, FNP   Date: 10/30/2022 11:50 AM   Virtual Visit via Video Note   I, Mary-Margaret Hassell Done, connected with Misty Butler ([id number], [birth year]) on 10/30/22 at  6:15 PM EDT by a video-enabled telemedicine application and verified that I am speaking with the correct person using two identifiers.  Location: Patient: Virtual Visit Location Patient: Home Provider: Virtual Visit Location Provider: Mobile   I discussed the  limitations of evaluation and management by telemedicine and the availability of in person appointments. The patient expressed understanding and agreed to proceed.    History of Present Illness: Misty Butler is a 31 y.o. who identifies as a female who was assigned female at birth, and is being seen today for covid positive.  HPI: Husband had covid last week and nw she has tested positive.  URI  This is a new problem. The current episode started today. The problem has been gradually worsening. There has been no fever. Associated symptoms include congestion, coughing, headaches, rhinorrhea and a sore throat. She has tried nothing for the symptoms. The treatment provided mild relief.    Review of Systems  HENT:  Positive for congestion, rhinorrhea and sore throat.   Respiratory:  Positive for cough.   Neurological:  Positive for headaches.    Problems:  Patient Active Problem List   Diagnosis Date Noted   Hemoptysis 07/05/2021   Mild intermittent asthma without complication 40/98/1191   Gastroesophageal reflux disease 12/18/2019   Depression 06/02/2013    Allergies:  Allergies  Allergen Reactions   Zithromax [Azithromycin]     Lightheaded and black out   Medications:  Current Outpatient Medications:    albuterol (PROVENTIL) (2.5 MG/3ML) 0.083% nebulizer solution, Take 3 mLs (2.5 mg total) by nebulization every 4 (four) hours as needed for wheezing or shortness of breath., Disp: 75 mL,  Rfl: 2   albuterol (VENTOLIN HFA) 108 (90 Base) MCG/ACT inhaler, USE 2 PUFFS EVERY 6 HOURS AS NEEDED, Disp: 8.5 g, Rfl: 2   budesonide-formoterol (SYMBICORT) 160-4.5 MCG/ACT inhaler, Inhale 2 puffs into the lungs 2 (two) times daily., Disp: 10.2 g, Rfl: 3   fexofenadine (ALLEGRA) 180 MG tablet, Take 180 mg by mouth daily., Disp: , Rfl:    montelukast (SINGULAIR) 10 MG tablet, Take 10 mg by mouth daily., Disp: , Rfl:   Observations/Objective: Patient is well-developed, well-nourished in no acute  distress.  Resting comfortably  at home.  Head is normocephalic, atraumatic.  No labored breathing.  Speech is clear and coherent with logical content.  Patient is alert and oriented at baseline.  Raspy voice  Assessment and Plan:  Misty Butler in today with chief complaint of covid positive  Decided to treat because patient has asthma 1. Positive self-administered antigen test for COVID-19 1. Take meds as prescribed 2. Use a cool mist humidifier especially during the winter months and when heat has been humid. 3. Use saline nose sprays frequently 4. Saline irrigations of the nose can be very helpful if done frequently.  * 4X daily for 1 week*  * Use of a nettie pot can be helpful with this. Follow directions with this* 5. Drink plenty of fluids 6. Keep thermostat turn down low 7.For any cough or congestion- mucinex or delsym OTC 8. For fever or aces or pains- take tylenol or ibuprofen appropriate for age and weight.  * for fevers greater than 101 orally you may alternate ibuprofen and tylenol every  3 hours.   Meds ordered this encounter  Medications   molnupiravir EUA (LAGEVRIO) 200 mg CAPS capsule    Sig: Take 4 capsules (800 mg total) by mouth 2 (two) times daily for 5 days.    Dispense:  40 capsule    Refill:  0    Order Specific Question:   Supervising Provider    Answer:   Arville Care A [[id number]]       Follow Up Instructions: I discussed the assessment and treatment plan with the patient. The patient was provided an opportunity to ask questions and all were answered. The patient agreed with the plan and demonstrated an understanding of the instructions.  A copy of instructions were sent to the patient via MyChart.  The patient was advised to call back or seek an in-person evaluation if the symptoms worsen or if the condition fails to improve as anticipated.  Time:  I spent 7 minutes with the patient via telehealth technology discussing the above  problems/concerns.    Mary-Margaret Daphine Deutscher, FNP

## 2023-01-10 ENCOUNTER — Telehealth (INDEPENDENT_AMBULATORY_CARE_PROVIDER_SITE_OTHER): Payer: BLUE CROSS/BLUE SHIELD | Admitting: Nurse Practitioner

## 2023-01-10 ENCOUNTER — Encounter: Payer: Self-pay | Admitting: Nurse Practitioner

## 2023-01-10 DIAGNOSIS — J029 Acute pharyngitis, unspecified: Secondary | ICD-10-CM

## 2023-01-10 MED ORDER — AMOXICILLIN 875 MG PO TABS: 875.0000 mg | ORAL_TABLET | Freq: Two times a day (BID) | ORAL | 0 refills | Status: DC

## 2023-01-10 NOTE — Patient Instructions (Signed)

## 2023-01-10 NOTE — Progress Notes (Signed)
Virtual Visit Consent   [name], you are scheduled for a virtual visit with Mary-Margaret Hassell Done, [place], a [facility] provider, today.     Just as with appointments in the office, your consent must be obtained to participate.  Your consent will be active for this visit and any virtual visit you may have with one of our providers in the next 365 days.     If you have a MyChart account, a copy of this consent can be sent to you electronically.  All virtual visits are billed to your insurance company just like a traditional visit in the office.    As this is a virtual visit, video technology does not allow for your provider to perform a traditional examination.  This may limit your provider's ability to fully assess your condition.  If your provider identifies any concerns that need to be evaluated in person or the need to arrange testing (such as labs, EKG, etc.), we will make arrangements to do so.     Although advances in technology are sophisticated, we cannot ensure that it will always work on either your end or our end.  If the connection with a video visit is poor, the visit may have to be switched to a telephone visit.  With either a video or telephone visit, we are not always able to ensure that we have a secure connection.     I need to obtain your verbal consent now.   Are you willing to proceed with your visit today? YES   DANAIJA ESKRIDGE has provided verbal consent on 01/10/2023 for a virtual visit (video or telephone).   Mary-Margaret Hassell Done, FNP   Date: 01/10/2023 9:58 AM   Virtual Visit via Video Note   I, Mary-Margaret Hassell Done, connected with Misty Butler ([id number], 07/09/1991) on 01/10/23 at 11:00 AM EST by a video-enabled telemedicine application and verified that I am speaking with the correct person using two identifiers.  Location: Patient: Virtual Visit Location Patient: Home Provider: Virtual Visit Location Provider: Mobile   I discussed the  limitations of evaluation and management by telemedicine and the availability of in person appointments. The patient expressed understanding and agreed to proceed.    History of Present Illness: Misty Butler is a 32 y.o. who identifies as a female who was assigned female at birth, and is being seen today for sore throat.  HPI: Sore Throat  This is a new problem. The current episode started yesterday. The problem has been gradually worsening. Neither side of throat is experiencing more pain than the other. The fever has been present for Less than 1 day. The pain is at a severity of 9/10. The pain is moderate. Associated symptoms include swollen glands. Pertinent negatives include no congestion, coughing or shortness of breath. She has had no exposure to strep. She has tried NSAIDs for the symptoms. The treatment provided mild relief.    Review of Systems  HENT:  Negative for congestion.   Respiratory:  Negative for cough and shortness of breath.     Problems:  Patient Active Problem List   Diagnosis Date Noted   Hemoptysis 07/05/2021   Mild intermittent asthma without complication 45/80/9983   Gastroesophageal reflux disease 12/18/2019   Depression 06/02/2013    Allergies:  Allergies  Allergen Reactions   Zithromax [Azithromycin]     Lightheaded and black out   Medications:  Current Outpatient Medications:    albuterol (PROVENTIL) (2.5 MG/3ML) 0.083% nebulizer solution, Take 3  mLs (2.5 mg total) by nebulization every 4 (four) hours as needed for wheezing or shortness of breath., Disp: 75 mL, Rfl: 2   albuterol (VENTOLIN HFA) 108 (90 Base) MCG/ACT inhaler, USE 2 PUFFS EVERY 6 HOURS AS NEEDED, Disp: 8.5 g, Rfl: 2   budesonide-formoterol (SYMBICORT) 160-4.5 MCG/ACT inhaler, Inhale 2 puffs into the lungs 2 (two) times daily., Disp: 10.2 g, Rfl: 3   fexofenadine (ALLEGRA) 180 MG tablet, Take 180 mg by mouth daily., Disp: , Rfl:    montelukast (SINGULAIR) 10 MG tablet, Take 10 mg by  mouth daily., Disp: , Rfl:   Observations/Objective: Patient is well-developed, well-nourished in no acute distress.  Resting comfortably  at home.  Head is normocephalic, atraumatic.  No labored breathing.  Speech is clear and coherent with logical content.  Patient is alert and oriented at baseline.  Posterior oral pharynx erythematous  Assessment and Plan:  Misty Butler in today with chief complaint of Sore Throat   1. Pharyngitis, unspecified etiology Force fluids Motrin or tylenol OTC OTC decongestant Throat lozenges if help New toothbrush in 3 days  Meds ordered this encounter  Medications   amoxicillin (AMOXIL) 875 MG tablet    Sig: Take 1 tablet (875 mg total) by mouth 2 (two) times daily. 1 po BID    Dispense:  20 tablet    Refill:  0    Order Specific Question:   Supervising Provider    Answer:   Caryl Pina A [[id number]]        Follow Up Instructions: I discussed the assessment and treatment plan with the patient. The patient was provided an opportunity to ask questions and all were answered. The patient agreed with the plan and demonstrated an understanding of the instructions.  A copy of instructions were sent to the patient via MyChart.  The patient was advised to call back or seek an in-person evaluation if the symptoms worsen or if the condition fails to improve as anticipated.  Time:  I spent 8 minutes with the patient via telehealth technology discussing the above problems/concerns.    Mary-Margaret Hassell Done, FNP

## 2023-01-25 ENCOUNTER — Encounter: Payer: BLUE CROSS/BLUE SHIELD | Admitting: Nurse Practitioner

## 2023-02-08 ENCOUNTER — Encounter: Payer: Self-pay | Admitting: Nurse Practitioner

## 2023-02-08 ENCOUNTER — Encounter: Payer: BLUE CROSS/BLUE SHIELD | Admitting: Nurse Practitioner

## 2023-05-17 ENCOUNTER — Other Ambulatory Visit: Payer: Self-pay | Admitting: Nurse Practitioner

## 2023-07-01 ENCOUNTER — Encounter: Payer: Self-pay | Admitting: Nurse Practitioner

## 2023-07-01 ENCOUNTER — Ambulatory Visit (INDEPENDENT_AMBULATORY_CARE_PROVIDER_SITE_OTHER): Payer: 59 | Admitting: Nurse Practitioner

## 2023-07-01 VITALS — BP 105/72 | HR 79 | Temp 98.0°F | Resp 20 | Ht 67.0 in | Wt 188.0 lb

## 2023-07-01 DIAGNOSIS — Z0000 Encounter for general adult medical examination without abnormal findings: Secondary | ICD-10-CM

## 2023-07-01 DIAGNOSIS — K219 Gastro-esophageal reflux disease without esophagitis: Secondary | ICD-10-CM | POA: Diagnosis not present

## 2023-07-01 DIAGNOSIS — F3342 Major depressive disorder, recurrent, in full remission: Secondary | ICD-10-CM | POA: Diagnosis not present

## 2023-07-01 DIAGNOSIS — Z0001 Encounter for general adult medical examination with abnormal findings: Secondary | ICD-10-CM | POA: Diagnosis not present

## 2023-07-01 NOTE — Patient Instructions (Signed)
Exercising to Stay Healthy To become healthy and stay healthy, it is recommended that you do moderate-intensity and vigorous-intensity exercise. You can tell that you are exercising at a moderate intensity if your heart starts beating faster and you start breathing faster but can still hold a conversation. You can tell that you are exercising at a vigorous intensity if you are breathing much harder and faster and cannot hold a conversation while exercising. How can exercise benefit me? Exercising regularly is important. It has many health benefits, such as: Improving overall fitness, flexibility, and endurance. Increasing bone density. Helping with weight control. Decreasing body fat. Increasing muscle strength and endurance. Reducing stress and tension, anxiety, depression, or anger. Improving overall health. What guidelines should I follow while exercising? Before you start a new exercise program, talk with your health care provider. Do not exercise so much that you hurt yourself, feel dizzy, or get very short of breath. Wear comfortable clothes and wear shoes with good support. Drink plenty of water while you exercise to prevent dehydration or heat stroke. Work out until your breathing and your heartbeat get faster (moderate intensity). How often should I exercise? Choose an activity that you enjoy, and set realistic goals. Your health care provider can help you make an activity plan that is individually designed and works best for you. Exercise regularly as told by your health care provider. This may include: Doing strength training two times a week, such as: Lifting weights. Using resistance bands. Push-ups. Sit-ups. Yoga. Doing a certain intensity of exercise for a given amount of time. Choose from these options: A total of 150 minutes of moderate-intensity exercise every week. A total of 75 minutes of vigorous-intensity exercise every week. A mix of moderate-intensity and  vigorous-intensity exercise every week. Children, pregnant women, people who have not exercised regularly, people who are overweight, and older adults may need to talk with a health care provider about what activities are safe to perform. If you have a medical condition, be sure to talk with your health care provider before you start a new exercise program. What are some exercise ideas? Moderate-intensity exercise ideas include: Walking 1 mile (1.6 km) in about 15 minutes. Biking. Hiking. Golfing. Dancing. Water aerobics. Vigorous-intensity exercise ideas include: Walking 4.5 miles (7.2 km) or more in about 1 hour. Jogging or running 5 miles (8 km) in about 1 hour. Biking 10 miles (16.1 km) or more in about 1 hour. Lap swimming. Roller-skating or in-line skating. Cross-country skiing. Vigorous competitive sports, such as football, basketball, and soccer. Jumping rope. Aerobic dancing. What are some everyday activities that can help me get exercise? Yard work, such as: Pushing a lawn mower. Raking and bagging leaves. Washing your car. Pushing a stroller. Shoveling snow. Gardening. Washing windows or floors. How can I be more active in my day-to-day activities? Use stairs instead of an elevator. Take a walk during your lunch break. If you drive, park your car farther away from your work or school. If you take public transportation, get off one stop early and walk the rest of the way. Stand up or walk around during all of your indoor phone calls. Get up, stretch, and walk around every 30 minutes throughout the day. Enjoy exercise with a friend. Support to continue exercising will help you keep a regular routine of activity. Where to find more information You can find more information about exercising to stay healthy from: U.S. Department of Health and Human Services: www.hhs.gov Centers for Disease Control and Prevention (  CDC): www.cdc.gov Summary Exercising regularly is  important. It will improve your overall fitness, flexibility, and endurance. Regular exercise will also improve your overall health. It can help you control your weight, reduce stress, and improve your bone density. Do not exercise so much that you hurt yourself, feel dizzy, or get very short of breath. Before you start a new exercise program, talk with your health care provider. This information is not intended to replace advice given to you by your health care provider. Make sure you discuss any questions you have with your health care provider. Document Revised: 04/14/2021 Document Reviewed: 04/14/2021 Elsevier Patient Education  2024 Elsevier Inc.  

## 2023-07-01 NOTE — Progress Notes (Signed)
Subjective:    Patient ID: Misty Butler, female    DOB: 15-Oct-1991, 32 y.o.   MRN: [id number]   Chief Complaint: Annual Exam (No pap)    HPI:  Misty Butler is a 32 y.o. who identifies as a female who was assigned female at birth.   Social history: Lives with: husband and kids Work history: works from home   Comes in today for follow up of the following chronic medical issues:  1. Annual physical exam   2. Gastroesophageal reflux disease, unspecified whether esophagitis present Doing well on OTC meds  3. Recurrent major depressive disorder, in full remission (HCC)    07/01/2023    2:56 PM 09/18/2022    8:54 AM 05/03/2022    2:09 PM  Depression screen PHQ 2/9  Decreased Interest 0 0 0  Down, Depressed, Hopeless 0 0 0  PHQ - 2 Score 0 0 0  Altered sleeping 0 1   Tired, decreased energy 1 1   Change in appetite 1 0   Feeling bad or failure about yourself  0 1   Trouble concentrating 0 0   Moving slowly or fidgety/restless 0 0   Suicidal thoughts 0 0   PHQ-9 Score 2 3   Difficult doing work/chores Not difficult at all Not difficult at all       New complaints: None today  Allergies  Allergen Reactions   Zithromax [Azithromycin]     Lightheaded and black out   Outpatient Encounter Medications as of 07/01/2023  Medication Sig   albuterol (VENTOLIN HFA) 108 (90 Base) MCG/ACT inhaler USE 2 PUFFS EVERY 6 HOURS AS NEEDED   budesonide-formoterol (SYMBICORT) 160-4.5 MCG/ACT inhaler Inhale 2 puffs into the lungs daily. (NEEDS TO BE SEEN BEFORE NEXT REFILL)   fexofenadine (ALLEGRA) 180 MG tablet Take 180 mg by mouth daily.   albuterol (PROVENTIL) (2.5 MG/3ML) 0.083% nebulizer solution Take 3 mLs (2.5 mg total) by nebulization every 4 (four) hours as needed for wheezing or shortness of breath.   montelukast (SINGULAIR) 10 MG tablet Take 10 mg by mouth daily. (Patient not taking: Reported on 07/01/2023)   [DISCONTINUED] amoxicillin (AMOXIL) 875 MG tablet Take 1 tablet  (875 mg total) by mouth 2 (two) times daily. 1 po BID   No facility-administered encounter medications on file as of 07/01/2023.    Past Surgical History:  Procedure Laterality Date   BIOPSY  01/05/2020   Procedure: BIOPSY;  Surgeon: West Bali, MD;  Location: [facility];  Service: Endoscopy;;   COLONOSCOPY WITH PROPOFOL N/A 01/05/2020   Procedure: COLONOSCOPY WITH PROPOFOL;  Surgeon: West Bali, MD;  Location: [facility];  Service: Endoscopy;  Laterality: N/A;  2:00    Family History  Problem Relation Age of Onset   Migraines Mother    Gout Father    Autism Sister    Colon cancer Other        passed in 80s   Colon cancer Other        passed in 63s      Controlled substance contract: n/a     Review of Systems  Constitutional:  Negative for diaphoresis.  Eyes:  Negative for pain.  Respiratory:  Negative for shortness of breath.   Cardiovascular:  Negative for chest pain, palpitations and leg swelling.  Gastrointestinal:  Negative for abdominal pain.  Endocrine: Negative for polydipsia.  Skin:  Negative for rash.  Neurological:  Negative for dizziness, weakness and headaches.  Hematological:  Does  not bruise/bleed easily.  All other systems reviewed and are negative.      Objective:   Physical Exam Vitals and nursing note reviewed.  Constitutional:      General: She is not in acute distress.    Appearance: Normal appearance. She is well-developed.  HENT:     Head: Normocephalic.     Right Ear: Tympanic membrane normal.     Left Ear: Tympanic membrane normal.     Nose: Nose normal.     Mouth/Throat:     Mouth: Mucous membranes are moist.  Eyes:     Pupils: Pupils are equal, round, and reactive to light.  Neck:     Vascular: No carotid bruit or JVD.  Cardiovascular:     Rate and Rhythm: Normal rate and regular rhythm.     Heart sounds: Normal heart sounds.  Pulmonary:     Effort: Pulmonary effort is normal. No respiratory distress.     Breath  sounds: Normal breath sounds. No wheezing or rales.  Chest:     Chest wall: No tenderness.  Abdominal:     General: Bowel sounds are normal. There is no distension or abdominal bruit.     Palpations: Abdomen is soft. There is no hepatomegaly, splenomegaly, mass or pulsatile mass.     Tenderness: There is no abdominal tenderness.  Musculoskeletal:        General: Normal range of motion.     Cervical back: Normal range of motion and neck supple.  Lymphadenopathy:     Cervical: No cervical adenopathy.  Skin:    General: Skin is warm and dry.  Neurological:     Mental Status: She is alert and oriented to person, place, and time.     Deep Tendon Reflexes: Reflexes are normal and symmetric.  Psychiatric:        Behavior: Behavior normal.        Thought Content: Thought content normal.        Judgment: Judgment normal.    BP 105/72   Pulse 79   Temp 98 F (36.7 C) (Temporal)   Resp 20   Ht 5\' 7"  (1.702 m)   Wt 188 lb (85.3 kg)   SpO2 98%   BMI 29.44 kg/m         Assessment & Plan:   Misty Butler comes in today with chief complaint of Annual Exam (No pap)   Diagnosis and orders addressed:  1. Annual physical exam Labs pending - CMP14+EGFR - CBC with Differential/Platelet - Lipid panel - Thyroid Panel With TSH - Vitamin B12 - VITAMIN D 25 Hydroxy (Vit-D Deficiency, Fractures)  2. Gastroesophageal reflux disease, unspecified whether esophagitis present Avoid spicy foods Do not eat 2 hours prior to bedtime   3. Recurrent major depressive disorder, in full remission [facility]) Stress management   Health Maintenance reviewed Diet and exercise encouraged  Follow up plan: 1 year   Mary-Margaret Daphine Deutscher, FNP

## 2023-07-02 LAB — CBC WITH DIFFERENTIAL/PLATELET
Basophils Absolute: 0 10*3/uL (ref 0.0–0.2)
Basos: 0 %
EOS (ABSOLUTE): 0.1 10*3/uL (ref 0.0–0.4)
Eos: 2 %
Hematocrit: 39.8 % (ref 34.0–46.6)
Hemoglobin: 13.4 g/dL (ref 11.1–15.9)
Immature Grans (Abs): 0 10*3/uL (ref 0.0–0.1)
Immature Granulocytes: 0 %
Lymphocytes Absolute: 2.2 10*3/uL (ref 0.7–3.1)
Lymphs: 28 %
MCH: 30.3 pg (ref 26.6–33.0)
MCHC: 33.7 g/dL (ref 31.5–35.7)
MCV: 90 fL (ref 79–97)
Monocytes Absolute: 0.4 10*3/uL (ref 0.1–0.9)
Monocytes: 6 %
Neutrophils Absolute: 4.9 10*3/uL (ref 1.4–7.0)
Neutrophils: 64 %
Platelets: 189 10*3/uL (ref 150–450)
RBC: 4.42 x10E6/uL (ref 3.77–5.28)
RDW: 11.9 % (ref 11.7–15.4)
WBC: 7.6 10*3/uL (ref 3.4–10.8)

## 2023-07-02 LAB — CMP14+EGFR
ALT: 12 IU/L (ref 0–32)
AST: 13 IU/L (ref 0–40)
Albumin: 4.3 g/dL (ref 3.9–4.9)
Alkaline Phosphatase: 64 IU/L (ref 44–121)
BUN/Creatinine Ratio: 20 (ref 9–23)
BUN: 15 mg/dL (ref 6–20)
Bilirubin Total: <0.2 mg/dL (ref 0.0–1.2)
CO2: 23 mmol/L (ref 20–29)
Calcium: 9.4 mg/dL (ref 8.7–10.2)
Chloride: 106 mmol/L (ref 96–106)
Creatinine, Ser: 0.74 mg/dL (ref 0.57–1.00)
Globulin, Total: 2.4 g/dL (ref 1.5–4.5)
Glucose: 85 mg/dL (ref 70–99)
Potassium: 4.1 mmol/L (ref 3.5–5.2)
Sodium: 142 mmol/L (ref 134–144)
Total Protein: 6.7 g/dL (ref 6.0–8.5)
eGFR: 109 mL/min/{1.73_m2} (ref >59)

## 2023-07-02 LAB — THYROID PANEL WITH TSH
Free Thyroxine Index: 2.9 (ref 1.2–4.9)
T3 Uptake Ratio: 28 % (ref 24–39)
T4, Total: 10.2 ug/dL (ref 4.5–12.0)
TSH: 1.03 u[IU]/mL (ref 0.450–4.500)

## 2023-07-02 LAB — LIPID PANEL
Chol/HDL Ratio: 4.4 ratio (ref 0.0–4.4)
Cholesterol, Total: 183 mg/dL (ref 100–199)
HDL: 42 mg/dL (ref >39)
LDL Chol Calc (NIH): 121 mg/dL — ABNORMAL HIGH (ref 0–99)
Triglycerides: 109 mg/dL (ref 0–149)
VLDL Cholesterol Cal: 20 mg/dL (ref 5–40)

## 2023-07-02 LAB — VITAMIN D 25 HYDROXY (VIT D DEFICIENCY, FRACTURES): Vit D, 25-Hydroxy: 20.5 ng/mL — ABNORMAL LOW (ref 30.0–100.0)

## 2023-07-02 LAB — VITAMIN B12: Vitamin B-12: 395 pg/mL (ref 232–1245)

## 2023-07-31 ENCOUNTER — Other Ambulatory Visit: Payer: Self-pay | Admitting: Nurse Practitioner

## 2023-11-16 IMAGING — DX DG FOOT COMPLETE 3+V*R*
3 series · 3 of 3 positions shown · non-contrast
Comparison: None Available.

CLINICAL DATA: Right foot toe pain.

EXAM:
RIGHT FOOT COMPLETE - 3+ VIEW

[foot ap]
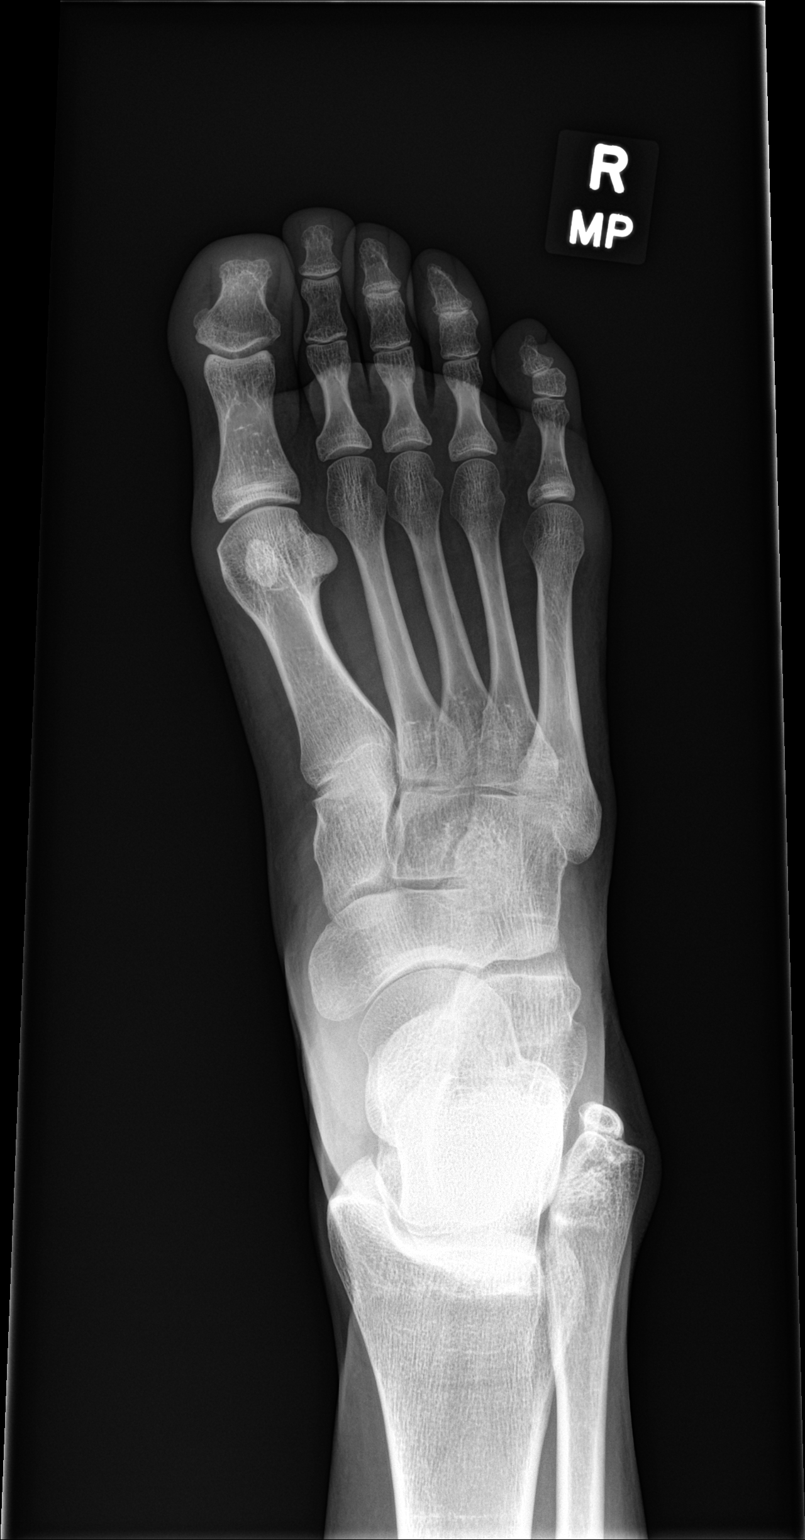

[foot obl]
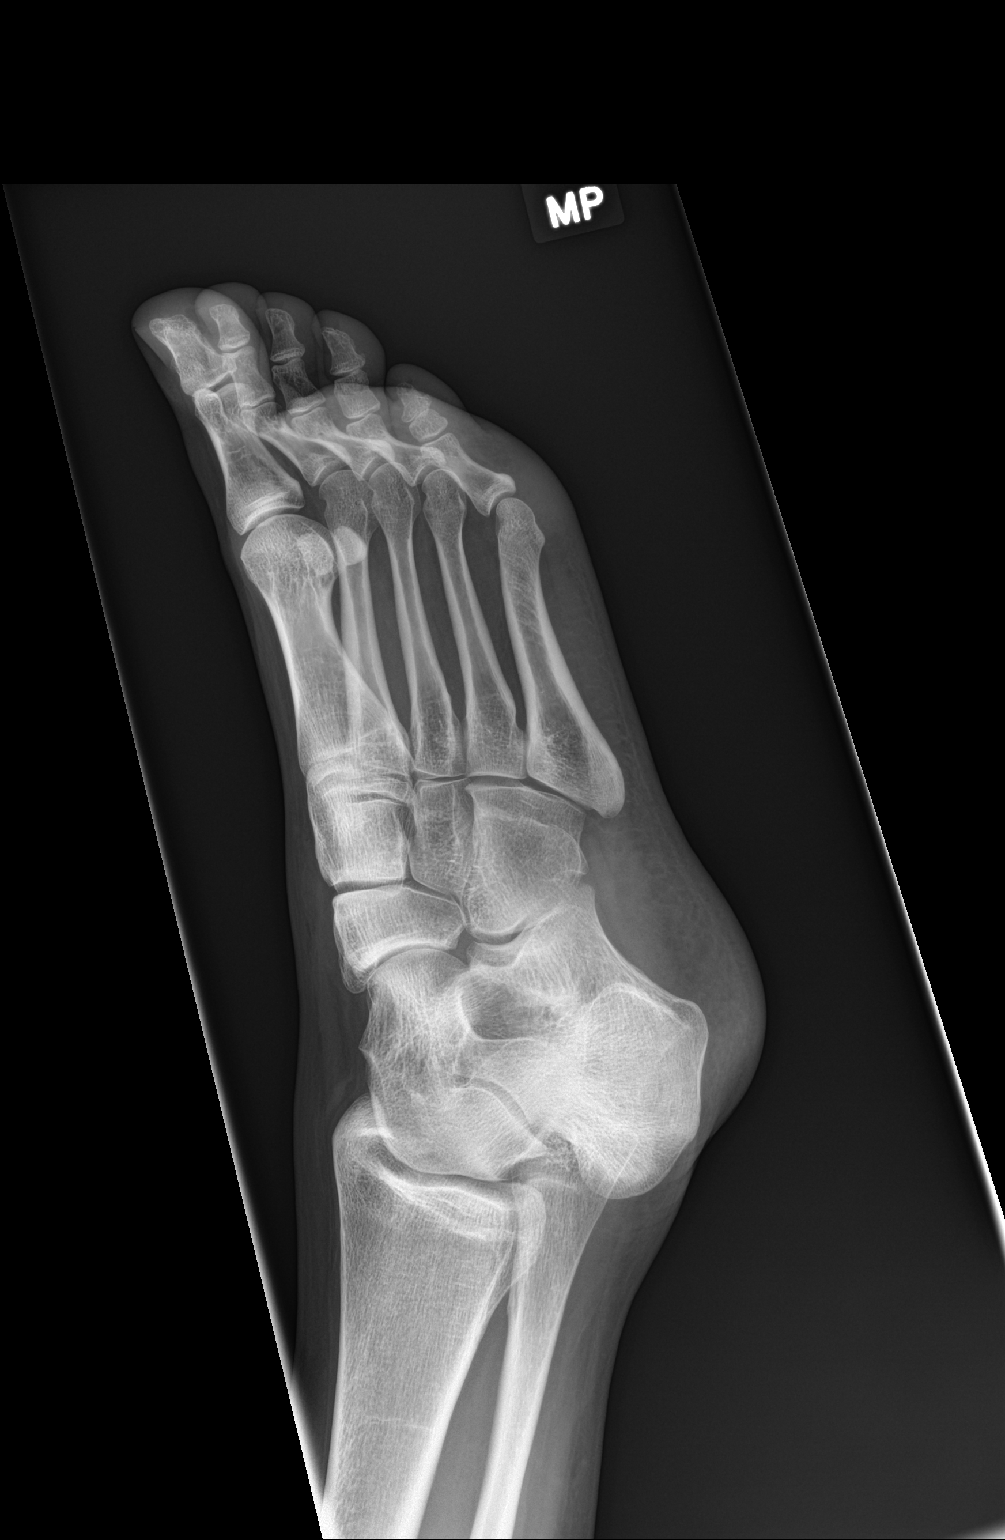

[foot lat]
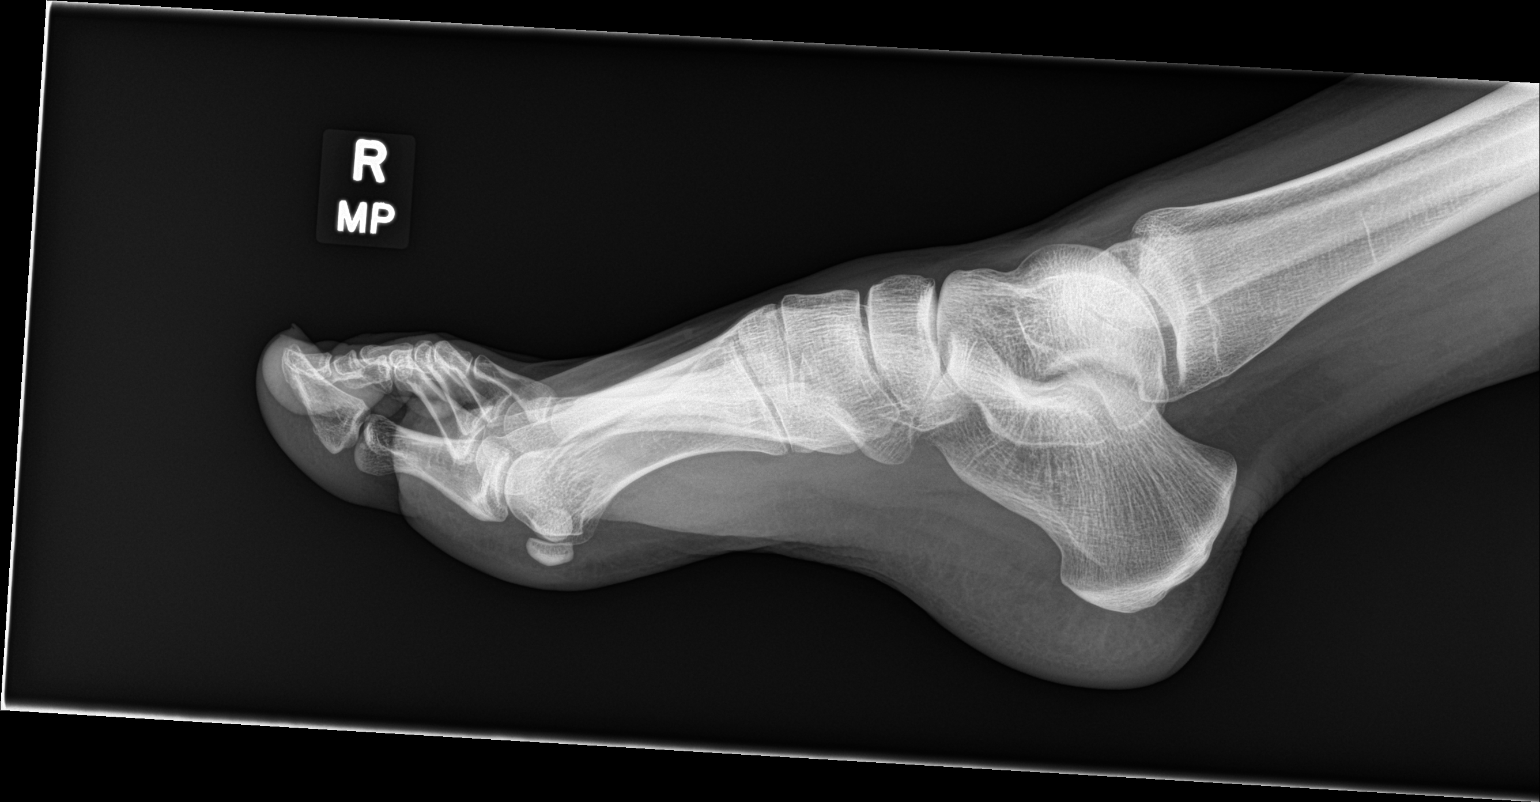

[3 of 3 positions shown; findings below may reference images not displayed]

FINDINGS: There is no evidence of fracture or dislocation. There is no
evidence of arthropathy or other focal bone abnormality. Soft
tissues are unremarkable.
IMPRESSION: No acute abnormality noted.

## 2023-12-20 ENCOUNTER — Other Ambulatory Visit: Payer: Self-pay | Admitting: Nurse Practitioner

## 2023-12-20 MED ORDER — BUDESONIDE-FORMOTEROL FUMARATE 160-4.5 MCG/ACT IN AERO: 2.0000 | INHALATION_SPRAY | Freq: Every day | RESPIRATORY_TRACT | 5 refills | Status: DC

## 2023-12-20 NOTE — Telephone Encounter (Signed)
Copied from CRM [id number]. Topic: Clinical - Medication Refill >> Dec 20, 2023 12:42 PM Dimitri Ped wrote: Most Recent Primary Care Visit:  Provider: Bennie Pierini  Department: [facility]  Visit Type: PHYSICAL  Date: 07/01/2023  Medication: ***  Has the patient contacted their pharmacy?  (Agent: If no, request that the patient contact the pharmacy for the refill. If patient does not wish to contact the pharmacy document the reason why and proceed with request.) (Agent: If yes, when and what did the pharmacy advise?)  Is this the correct pharmacy for this prescription?  If no, delete pharmacy and type the correct one.  This is the patient's preferred pharmacy:  [facility] [place] - [address] [place] [address] Phone: [phone] Fax: [phone]   Has the prescription been filled recently?   Is the patient out of the medication?   Has the patient been seen for an appointment in the last year OR does the patient have an upcoming appointment?   Can we respond through MyChart?   Agent: Please be advised that Rx refills may take up to 3 business days. We ask that you follow-up with your pharmacy.

## 2024-01-07 ENCOUNTER — Telehealth: Payer: Self-pay | Admitting: Nurse Practitioner

## 2024-01-07 NOTE — Telephone Encounter (Signed)
 Rx requiring PA  Copied from CRM [id number]. Topic: General - Other >> Jan 07, 2024 11:42 AM Misty Butler wrote: Reason for CRM: Patient stated that she needs a pre-authorization for budesonide -formoterol  (SYMBICORT ) 160-4.5 MCG/ACT inhaler. Asking that someone contact her [employer]. Provided [phone] for Pre-certs.

## 2024-01-08 ENCOUNTER — Other Ambulatory Visit (HOSPITAL_COMMUNITY): Payer: Self-pay

## 2024-01-08 ENCOUNTER — Telehealth: Payer: Self-pay

## 2024-01-08 NOTE — Telephone Encounter (Signed)
*  Primary  Pharmacy Patient Advocate Encounter   Received notification from Pt Calls Messages that prior authorization for Symbicort  160-4.5MCG/ACT aerosol  is required/requested.   Insurance verification completed.   The patient is insured through [facility] .   Per test claim: PA required; PA submitted to above mentioned insurance via CoverMyMeds Key/confirmation #/EOC [id number] Status is pending

## 2024-01-08 NOTE — Telephone Encounter (Signed)
 PA request has been Submitted. New Encounter created for follow up. For additional info see Pharmacy Prior Auth telephone encounter from 01/08.

## 2024-01-09 ENCOUNTER — Other Ambulatory Visit (HOSPITAL_COMMUNITY): Payer: Self-pay

## 2024-01-13 ENCOUNTER — Telehealth: Payer: Self-pay | Admitting: Nurse Practitioner

## 2024-01-13 NOTE — Telephone Encounter (Signed)
 There is no message in this call  This encounter will be closed

## 2024-01-14 ENCOUNTER — Other Ambulatory Visit (HOSPITAL_COMMUNITY): Payer: Self-pay

## 2024-01-14 NOTE — Telephone Encounter (Signed)
 Approved-copay for 30 days is $110.00

## 2024-01-21 MED ORDER — OSELTAMIVIR PHOSPHATE 75 MG PO CAPS: 75.0000 mg | ORAL_CAPSULE | Freq: Two times a day (BID) | ORAL | 0 refills | Status: AC

## 2024-01-21 NOTE — Telephone Encounter (Signed)
Positive flu A on at h ome test. Fever 104. Bidy aches, cough and ocngestion  Meds ordered this encounter  Medications   oseltamivir (TAMIFLU) 75 MG capsule    Sig: Take 1 capsule (75 mg total) by mouth 2 (two) times daily.    Dispense:  10 capsule    Refill:  0    Supervising Provider:   Arville Care A [[id number]]   Mary-Margaret Daphine Deutscher, FNP

## 2024-02-03 ENCOUNTER — Other Ambulatory Visit (HOSPITAL_COMMUNITY): Payer: Self-pay

## 2024-02-03 ENCOUNTER — Telehealth: Payer: Self-pay | Admitting: Nurse Practitioner

## 2024-02-03 NOTE — Telephone Encounter (Signed)
 Please review

## 2024-02-03 NOTE — Telephone Encounter (Unsigned)
Copied from CRM [id number]. Topic: General - Other >> Feb 03, 2024 10:29 AM Mosetta Putt H wrote: Reason for CRM: Prior auth needed for budesonide-formoterol (SYMBICORT) 160-4.5 MCG/ACT inhaler

## 2024-02-03 NOTE — Telephone Encounter (Signed)
PA request has been Approved. New Encounter created for follow up. For additional info see Pharmacy Prior Auth telephone encounter from 01/08/24.

## 2024-09-08 ENCOUNTER — Other Ambulatory Visit: Payer: Self-pay | Admitting: Nurse Practitioner

## 2024-09-08 MED ORDER — BUDESONIDE-FORMOTEROL FUMARATE 160-4.5 MCG/ACT IN AERO: 2.0000 | INHALATION_SPRAY | Freq: Every day | RESPIRATORY_TRACT | 5 refills | Status: DC

## 2024-09-08 MED ORDER — BUDESONIDE-FORMOTEROL FUMARATE 160-4.5 MCG/ACT IN AERO: 2.0000 | INHALATION_SPRAY | Freq: Two times a day (BID) | RESPIRATORY_TRACT | 5 refills | Status: DC

## 2024-09-08 NOTE — Telephone Encounter (Signed)
 Pt schedule an apt for 10/19/2024. Can refill be sent in until apt?

## 2024-09-08 NOTE — Telephone Encounter (Signed)
 MMM NTBS last OV 07/01/23 NO RF sent to pharmacy last OV greater than a year

## 2024-09-08 NOTE — Addendum Note (Signed)
 Addended by: Atif Chapple D on: 09/08/2024 02:43 PM   Modules accepted: Orders

## 2024-09-28 ENCOUNTER — Encounter: Payer: Self-pay | Admitting: Nurse Practitioner

## 2024-09-28 ENCOUNTER — Ambulatory Visit (INDEPENDENT_AMBULATORY_CARE_PROVIDER_SITE_OTHER): Admitting: Nurse Practitioner

## 2024-09-28 VITALS — BP 115/76 | HR 84 | Temp 97.6°F | Ht 67.0 in | Wt 191.0 lb

## 2024-09-28 DIAGNOSIS — Z23 Encounter for immunization: Secondary | ICD-10-CM

## 2024-09-28 DIAGNOSIS — Z0001 Encounter for general adult medical examination with abnormal findings: Secondary | ICD-10-CM | POA: Diagnosis not present

## 2024-09-28 DIAGNOSIS — F3342 Major depressive disorder, recurrent, in full remission: Secondary | ICD-10-CM | POA: Diagnosis not present

## 2024-09-28 DIAGNOSIS — K219 Gastro-esophageal reflux disease without esophagitis: Secondary | ICD-10-CM | POA: Diagnosis not present

## 2024-09-28 DIAGNOSIS — Z0000 Encounter for general adult medical examination without abnormal findings: Secondary | ICD-10-CM

## 2024-09-28 DIAGNOSIS — J4520 Mild intermittent asthma, uncomplicated: Secondary | ICD-10-CM

## 2024-09-28 LAB — LIPID PANEL

## 2024-09-28 MED ORDER — BUDESONIDE-FORMOTEROL FUMARATE 160-4.5 MCG/ACT IN AERO
2.0000 | INHALATION_SPRAY | Freq: Two times a day (BID) | RESPIRATORY_TRACT | 5 refills | Status: AC
Start: 2024-09-28 — End: ?

## 2024-09-28 MED ORDER — ALBUTEROL SULFATE HFA 108 (90 BASE) MCG/ACT IN AERS
INHALATION_SPRAY | RESPIRATORY_TRACT | 2 refills | Status: AC
Start: 2024-09-28 — End: ?

## 2024-09-28 NOTE — Progress Notes (Signed)
 Subjective:    Patient ID: Misty Butler, female    DOB: [birth year], 33 y.o.   MRN: [id number]   Chief Complaint: Annual Exam (No pap)    HPI:  Misty Butler is a 33 y.o. who identifies as a female who was assigned female at birth.   Social history: Lives with: husband and kids Work history: works from home   Comes in today for follow up of the following chronic medical issues:  1. Annual physical exam   2. Gastroesophageal reflux disease, unspecified whether esophagitis present Doing well on OTC meds  3. Recurrent major depressive disorder, in full remission (HCC)    09/28/2024    3:36 PM 07/01/2023    2:56 PM 09/18/2022    8:54 AM  Depression screen PHQ 2/9  Decreased Interest 1 0 0  Down, Depressed, Hopeless 0 0 0  PHQ - 2 Score 1 0 0  Altered sleeping 1 0 1  Tired, decreased energy 1 1 1   Change in appetite 0 1 0  Feeling bad or failure about yourself  0 0 1  Trouble concentrating 1 0 0  Moving slowly or fidgety/restless 0 0 0  Suicidal thoughts 0 0 0  PHQ-9 Score 4 2 3   Difficult doing work/chores Somewhat difficult Not difficult at all Not difficult at all      New complaints: None today  Allergies  Allergen Reactions   Zithromax [Azithromycin]     Lightheaded and black out   Outpatient Encounter Medications as of 09/28/2024  Medication Sig   albuterol  (PROVENTIL ) (2.5 MG/3ML) 0.083% nebulizer solution Take 3 mLs (2.5 mg total) by nebulization every 4 (four) hours as needed for wheezing or shortness of breath.   albuterol  (VENTOLIN  HFA) 108 (90 Base) MCG/ACT inhaler USE 2 PUFFS EVERY 6 HOURS AS NEEDED   budesonide -formoterol  (SYMBICORT ) 160-4.5 MCG/ACT inhaler Inhale 2 puffs into the lungs in the morning and at bedtime.   fexofenadine (ALLEGRA) 180 MG tablet Take 180 mg by mouth daily.   oseltamivir  (TAMIFLU ) 75 MG capsule Take 1 capsule (75 mg total) by mouth 2 (two) times daily.   montelukast (SINGULAIR) 10 MG tablet Take 10 mg by mouth daily.  (Patient not taking: Reported on 09/28/2024)   No facility-administered encounter medications on file as of 09/28/2024.    Past Surgical History:  Procedure Laterality Date   BIOPSY  01/05/2020   Procedure: BIOPSY;  Surgeon: Harvey Margo CROME, MD;  Location: [facility];  Service: Endoscopy;;   COLONOSCOPY WITH PROPOFOL  N/A 01/05/2020   Procedure: COLONOSCOPY WITH PROPOFOL ;  Surgeon: Harvey Margo CROME, MD;  Location: [facility];  Service: Endoscopy;  Laterality: N/A;  2:00    Family History  Problem Relation Age of Onset   Migraines Mother    Gout Father    Autism Sister    Colon cancer Other        passed in 71s   Colon cancer Other        passed in 15s      Controlled substance contract: n/a     Review of Systems  Constitutional:  Negative for diaphoresis.  Eyes:  Negative for pain.  Respiratory:  Negative for shortness of breath.   Cardiovascular:  Negative for chest pain, palpitations and leg swelling.  Gastrointestinal:  Negative for abdominal pain.  Endocrine: Negative for polydipsia.  Skin:  Negative for rash.  Neurological:  Negative for dizziness, weakness and headaches.  Hematological:  Does not bruise/bleed easily.  All  other systems reviewed and are negative.      Objective:   Physical Exam Vitals and nursing note reviewed.  Constitutional:      General: She is not in acute distress.    Appearance: Normal appearance. She is well-developed.  HENT:     Head: Normocephalic.     Right Ear: Tympanic membrane normal.     Left Ear: Tympanic membrane normal.     Nose: Nose normal.     Mouth/Throat:     Mouth: Mucous membranes are moist.  Eyes:     Pupils: Pupils are equal, round, and reactive to light.  Neck:     Vascular: No carotid bruit or JVD.  Cardiovascular:     Rate and Rhythm: Normal rate and regular rhythm.     Heart sounds: Normal heart sounds.  Pulmonary:     Effort: Pulmonary effort is normal. No respiratory distress.     Breath sounds:  Normal breath sounds. No wheezing or rales.  Chest:     Chest wall: No tenderness.  Abdominal:     General: Bowel sounds are normal. There is no distension or abdominal bruit.     Palpations: Abdomen is soft. There is no hepatomegaly, splenomegaly, mass or pulsatile mass.     Tenderness: There is no abdominal tenderness.  Musculoskeletal:        General: Normal range of motion.     Cervical back: Normal range of motion and neck supple.  Lymphadenopathy:     Cervical: No cervical adenopathy.  Skin:    General: Skin is warm and dry.  Neurological:     Mental Status: She is alert and oriented to person, place, and time.     Deep Tendon Reflexes: Reflexes are normal and symmetric.  Psychiatric:        Behavior: Behavior normal.        Thought Content: Thought content normal.        Judgment: Judgment normal.    BP 115/76   Pulse 84   Temp 97.6 F (36.4 C) (Temporal)   Ht 5' 7 (1.702 m)   Wt 191 lb (86.6 kg)   SpO2 96%   BMI 29.91 kg/m         Assessment & Plan:   Misty Butler comes in today with chief complaint of Annual Exam (No pap)   Diagnosis and orders addressed:  1. Annual physical exam Labs pending - CMP14+EGFR - CBC with Differential/Platelet - Lipid panel - Thyroid  Panel With TSH - Vitamin B12 - VITAMIN D  25 Hydroxy (Vit-D Deficiency, Fractures)  2. Gastroesophageal reflux disease, unspecified whether esophagitis present Avoid spicy foods Do not eat 2 hours prior to bedtime   3. Recurrent major depressive disorder, in full remission [facility]) Stress management   Health Maintenance reviewed Diet and exercise encouraged  Follow up plan: 1 year   Mary-Margaret Gladis, FNP

## 2024-09-28 NOTE — Patient Instructions (Signed)
 Exercising to Stay Healthy To become healthy and stay healthy, it is recommended that you do moderate-intensity and vigorous-intensity exercise. You can tell that you are exercising at a moderate intensity if your heart starts beating faster and you start breathing faster but can still hold a conversation. You can tell that you are exercising at a vigorous intensity if you are breathing much harder and faster and cannot hold a conversation while exercising. How can exercise benefit me? Exercising regularly is important. It has many health benefits, such as: Improving overall fitness, flexibility, and endurance. Increasing bone density. Helping with weight control. Decreasing body fat. Increasing muscle strength and endurance. Reducing stress and tension, anxiety, depression, or anger. Improving overall health. What guidelines should I follow while exercising? Before you start a new exercise program, talk with your health care provider. Do not exercise so much that you hurt yourself, feel dizzy, or get very short of breath. Wear comfortable clothes and wear shoes with good support. Drink plenty of water while you exercise to prevent dehydration or heat stroke. Work out until your breathing and your heartbeat get faster (moderate intensity). How often should I exercise? Choose an activity that you enjoy, and set realistic goals. Your health care provider can help you make an activity plan that is individually designed and works best for you. Exercise regularly as told by your health care provider. This may include: Doing strength training two times a week, such as: Lifting weights. Using resistance bands. Push-ups. Sit-ups. Yoga. Doing a certain intensity of exercise for a given amount of time. Choose from these options: A total of 150 minutes of moderate-intensity exercise every week. A total of 75 minutes of vigorous-intensity exercise every week. A mix of moderate-intensity and  vigorous-intensity exercise every week. Children, pregnant women, people who have not exercised regularly, people who are overweight, and older adults may need to talk with a health care provider about what activities are safe to perform. If you have a medical condition, be sure to talk with your health care provider before you start a new exercise program. What are some exercise ideas? Moderate-intensity exercise ideas include: Walking 1 mile (1.6 km) in about 15 minutes. Biking. Hiking. Golfing. Dancing. Water aerobics. Vigorous-intensity exercise ideas include: Walking 4.5 miles (7.2 km) or more in about 1 hour. Jogging or running 5 miles (8 km) in about 1 hour. Biking 10 miles (16.1 km) or more in about 1 hour. Lap swimming. Roller-skating or in-line skating. Cross-country skiing. Vigorous competitive sports, such as football, basketball, and soccer. Jumping rope. Aerobic dancing. What are some everyday activities that can help me get exercise? Yard work, such as: Child psychotherapist. Raking and bagging leaves. Washing your car. Pushing a stroller. Shoveling snow. Gardening. Washing windows or floors. How can I be more active in my day-to-day activities? Use stairs instead of an elevator. Take a walk during your lunch break. If you drive, park your car farther away from your work or school. If you take public transportation, get off one stop early and walk the rest of the way. Stand up or walk around during all of your indoor phone calls. Get up, stretch, and walk around every 30 minutes throughout the day. Enjoy exercise with a friend. Support to continue exercising will help you keep a regular routine of activity. Where to find more information You can find more information about exercising to stay healthy from: [place]. Department of Health and Human Services: ThisPath.fi Centers for Disease Control and Prevention (  CDC): [employer]Ex[employer].com.br Summary Exercising regularly is  important. It will improve your overall fitness, flexibility, and endurance. Regular exercise will also improve your overall health. It can help you control your weight, reduce stress, and improve your bone density. Do not exercise so much that you hurt yourself, feel dizzy, or get very short of breath. Before you start a new exercise program, talk with your health care provider. This information is not intended to replace advice given to you by your health care provider. Make sure you discuss any questions you have with your health care provider. Document Revised: 04/14/2021 Document Reviewed: 04/14/2021 Elsevier Patient Education  2024 [employer].

## 2024-09-29 ENCOUNTER — Ambulatory Visit: Payer: Self-pay | Admitting: Nurse Practitioner

## 2024-09-29 LAB — CMP14+EGFR
ALT: 11 IU/L (ref 0–32)
AST: 12 IU/L (ref 0–40)
Albumin: 4.5 g/dL (ref 3.9–4.9)
Alkaline Phosphatase: 78 IU/L (ref 41–116)
BUN/Creatinine Ratio: 16 (ref 9–23)
BUN: 13 mg/dL (ref 6–20)
Bilirubin Total: <0.2 mg/dL (ref 0.0–1.2)
CO2: 22 mmol/L (ref 20–29)
Calcium: 9.6 mg/dL (ref 8.7–10.2)
Chloride: 105 mmol/L (ref 96–106)
Creatinine, Ser: 0.79 mg/dL (ref 0.57–1.00)
Globulin, Total: 2.4 g/dL (ref 1.5–4.5)
Glucose: 91 mg/dL (ref 70–99)
Potassium: 4 mmol/L (ref 3.5–5.2)
Sodium: 142 mmol/L (ref 134–144)
Total Protein: 6.9 g/dL (ref 6.0–8.5)
eGFR: 100 mL/min/1.73 (ref >59)

## 2024-09-29 LAB — THYROID PANEL WITH TSH
Free Thyroxine Index: 2.6 (ref 1.2–4.9)
T3 Uptake Ratio: 28 % (ref 24–39)
T4, Total: 9.4 ug/dL (ref 4.5–12.0)
TSH: 1.22 u[IU]/mL (ref 0.450–4.500)

## 2024-09-29 LAB — CBC WITH DIFFERENTIAL/PLATELET
Basophils Absolute: 0 x10E3/uL (ref 0.0–0.2)
Basos: 0 %
EOS (ABSOLUTE): 0.1 x10E3/uL (ref 0.0–0.4)
Eos: 2 %
Hematocrit: 42 % (ref 34.0–46.6)
Hemoglobin: 13.7 g/dL (ref 11.1–15.9)
Immature Grans (Abs): 0 x10E3/uL (ref 0.0–0.1)
Immature Granulocytes: 0 %
Lymphocytes Absolute: 1.9 x10E3/uL (ref 0.7–3.1)
Lymphs: 24 %
MCH: 30.4 pg (ref 26.6–33.0)
MCHC: 32.6 g/dL (ref 31.5–35.7)
MCV: 93 fL (ref 79–97)
Monocytes Absolute: 0.5 x10E3/uL (ref 0.1–0.9)
Monocytes: 7 %
Neutrophils Absolute: 5.5 x10E3/uL (ref 1.4–7.0)
Neutrophils: 67 %
Platelets: 178 x10E3/uL (ref 150–450)
RBC: 4.51 x10E6/uL (ref 3.77–5.28)
RDW: 11.9 % (ref 11.7–15.4)
WBC: 8.2 x10E3/uL (ref 3.4–10.8)

## 2024-09-29 LAB — LIPID PANEL
Cholesterol, Total: 186 mg/dL (ref 100–199)
HDL: 45 mg/dL (ref >39)
LDL CALC COMMENT:: 4.1 ratio (ref 0.0–4.4)
LDL Chol Calc (NIH): 110 mg/dL — AB (ref 0–99)
Triglycerides: 176 mg/dL — AB (ref 0–149)
VLDL Cholesterol Cal: 31 mg/dL (ref 5–40)

## 2024-09-29 LAB — VITAMIN D 25 HYDROXY (VIT D DEFICIENCY, FRACTURES): Vit D, 25-Hydroxy: 17.9 ng/mL — AB (ref 30.0–100.0)

## 2024-09-29 LAB — VITAMIN B12: Vitamin B-12: 413 pg/mL (ref 232–1245)

## 2024-10-03 ENCOUNTER — Telehealth: Admitting: Nurse Practitioner

## 2024-10-03 DIAGNOSIS — H109 Unspecified conjunctivitis: Secondary | ICD-10-CM

## 2024-10-03 DIAGNOSIS — B9689 Other specified bacterial agents as the cause of diseases classified elsewhere: Secondary | ICD-10-CM

## 2024-10-03 MED ORDER — POLYMYXIN B-TRIMETHOPRIM 10000-0.1 UNIT/ML-% OP SOLN: 1.0000 [drp] | OPHTHALMIC | 0 refills | Status: AC

## 2024-10-03 NOTE — Progress Notes (Signed)
 E-Visit for [employer]   We are sorry that you are not feeling well.  Here is how we plan to help!  Based on what you have shared with me it looks like you have conjunctivitis.  Conjunctivitis is a common inflammatory or infectious condition of the eye that is often referred to as pink eye.  In most cases it is contagious (viral or bacterial). However, not all conjunctivitis requires antibiotics (ex. Allergic).  We have made appropriate suggestions for you based upon your presentation.  I have prescribed Polytrim Ophthalmic drops 1-2 drops 4 times a day times 5 days  Pink eye can be highly contagious.  It is typically spread through direct contact with secretions, or contaminated objects or surfaces that one may have touched.  Strict handwashing is suggested with soap and water  is urged.  If not available, use alcohol based had sanitizer.  Avoid unnecessary touching of the eye.  If you wear contact lenses, you will need to refrain from wearing them until you see no white discharge from the eye for at least 24 hours after being on medication.  You should see symptom improvement in 1-2 days after starting the medication regimen.  Call us  if symptoms are not improved in 1-2 days.  Home Care: Wash your hands often! Do not wear your contacts until you complete your treatment plan. Avoid sharing towels, bed linen, personal items with a person who has pink eye. See attention for anyone in your home with similar symptoms.  Get Help Right Away If: Your symptoms do not improve. You develop blurred or loss of vision. Your symptoms worsen (increased discharge, pain or redness)   Thank you for choosing an e-visit.  Your e-visit answers were reviewed by a board certified advanced clinical practitioner to complete your personal care plan. Depending upon the condition, your plan could have included both over the counter or prescription medications.  Please review your pharmacy choice. Make sure the  pharmacy is open so you can pick up prescription now. If there is a problem, you may contact your provider through [employer] and have the prescription routed to another pharmacy.  Your safety is important to us . If you have drug allergies check your prescription carefully.   For the next 24 hours you can use MyChart to ask questions about today's visit, request a non-urgent call back, or ask for a work or school excuse. You will get an email in the next two days asking about your experience. I hope that your e-visit has been valuable and will speed your recovery.  I have spent 5 minutes in review of e-visit questionnaire, review and updating patient chart, medical decision making and response to patient.   Tiffanyann Deroo W Ezri Fanguy, NP

## 2024-10-19 ENCOUNTER — Ambulatory Visit: Admitting: Nurse Practitioner
